# Patient Record
Sex: Female | Born: 1995 | Race: Black or African American | Hispanic: No | Marital: Single | Smoking: Never smoker
Health system: Southern US, Community
[De-identification: ages and names within clinical notes are randomized; demographics above are authoritative.]

## PROBLEM LIST (undated history)

## (undated) DIAGNOSIS — F32A Depression, unspecified: Secondary | ICD-10-CM

## (undated) DIAGNOSIS — O021 Missed abortion: Secondary | ICD-10-CM

## (undated) DIAGNOSIS — F329 Major depressive disorder, single episode, unspecified: Secondary | ICD-10-CM

## (undated) DIAGNOSIS — D649 Anemia, unspecified: Secondary | ICD-10-CM

## (undated) HISTORY — DX: Missed abortion: O02.1

## (undated) HISTORY — PX: NO PAST SURGERIES: SHX2092

## (undated) HISTORY — PX: WISDOM TOOTH EXTRACTION: SHX21

## (undated) SURGERY — Surgical Case
Anesthesia: Regional

---
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
## 2016-01-12 ENCOUNTER — Encounter: Payer: Medicaid Other | Admitting: Adult Health

## 2016-01-12 ENCOUNTER — Other Ambulatory Visit: Payer: Medicaid Other

## 2016-01-20 ENCOUNTER — Encounter: Payer: Self-pay | Admitting: Women's Health

## 2016-01-20 ENCOUNTER — Ambulatory Visit (INDEPENDENT_AMBULATORY_CARE_PROVIDER_SITE_OTHER): Payer: Medicaid Other | Admitting: Women's Health

## 2016-01-20 ENCOUNTER — Other Ambulatory Visit: Payer: Medicaid Other

## 2016-01-20 VITALS — BP 102/56 | HR 76 | Wt 174.0 lb

## 2016-01-20 DIAGNOSIS — Z331 Pregnant state, incidental: Secondary | ICD-10-CM

## 2016-01-20 DIAGNOSIS — O34219 Maternal care for unspecified type scar from previous cesarean delivery: Secondary | ICD-10-CM

## 2016-01-20 DIAGNOSIS — Z3A29 29 weeks gestation of pregnancy: Secondary | ICD-10-CM

## 2016-01-20 DIAGNOSIS — Z3483 Encounter for supervision of other normal pregnancy, third trimester: Secondary | ICD-10-CM

## 2016-01-20 DIAGNOSIS — Z1389 Encounter for screening for other disorder: Secondary | ICD-10-CM

## 2016-01-20 DIAGNOSIS — Z3493 Encounter for supervision of normal pregnancy, unspecified, third trimester: Secondary | ICD-10-CM

## 2016-01-20 LAB — POCT URINALYSIS DIPSTICK
Blood, UA: NEGATIVE
GLUCOSE UA: NEGATIVE
KETONES UA: NEGATIVE
Nitrite, UA: NEGATIVE

## 2016-01-20 NOTE — Patient Instructions (Addendum)
You will have your sugar test next visit.  Please do not eat or drink anything after midnight the night before you come, not even water.  You will be here for at least two hours.     Call the office ([phone]) or go to [facility] if:  You begin to have strong, frequent contractions  Your water breaks.  Sometimes it is a big gush of fluid, sometimes it is just a trickle that keeps getting your panties wet or running down your legs  You have vaginal bleeding.  It is normal to have a small amount of spotting if your cervix was checked.   You don't feel your baby moving like normal.  If you don't, get you something to eat and drink and lay down and focus on feeling your baby move.  You should feel at least 10 movements in 2 hours.  If you don't, you should call the office or go to [facility].    Tdap Vaccine  It is recommended that you get the Tdap vaccine during the third trimester of EACH pregnancy to help protect your baby from getting pertussis (whooping cough)  27-36 weeks is the BEST time to do this so that you can pass the protection on to your baby. During pregnancy is better than after pregnancy, but if you are unable to get it during pregnancy it will be offered at the hospital.   You can get this vaccine at the health department or your family doctor  Everyone who will be around your baby should also be up-to-date on their vaccines. Adults (who are not pregnant) only need 1 dose of Tdap during adulthood.   Third Trimester of Pregnancy The third trimester is from week 29 through week 42, months 7 through 9. The third trimester is a time when the fetus is growing rapidly. At the end of the ninth month, the fetus is about 20 inches in length and weighs 6-10 pounds.  BODY CHANGES Your body goes through many changes during pregnancy. The changes vary from woman to woman.   Your weight will continue to increase. You can expect to gain 25-35 pounds (11-16 kg) by the end of the  pregnancy.  You may begin to get stretch marks on your hips, abdomen, and breasts.  You may urinate more often because the fetus is moving lower into your pelvis and pressing on your bladder.  You may develop or continue to have heartburn as a result of your pregnancy.  You may develop constipation because certain hormones are causing the muscles that push waste through your intestines to slow down.  You may develop hemorrhoids or swollen, bulging veins (varicose veins).  You may have pelvic pain because of the weight gain and pregnancy hormones relaxing your joints between the bones in your pelvis. Backaches may result from overexertion of the muscles supporting your posture.  You may have changes in your hair. These can include thickening of your hair, rapid growth, and changes in texture. Some women also have hair loss during or after pregnancy, or hair that feels dry or thin. Your hair will most likely return to normal after your baby is born.  Your breasts will continue to grow and be tender. A yellow discharge may leak from your breasts called colostrum.  Your belly button may stick out.  You may feel short of breath because of your expanding uterus.  You may notice the fetus "dropping," or moving lower in your abdomen.  You may have   a bloody mucus discharge. This usually occurs a few days to a week before labor begins.  Your cervix becomes thin and soft (effaced) near your due date. WHAT TO EXPECT AT YOUR PRENATAL EXAMS  You will have prenatal exams every 2 weeks until week 36. Then, you will have weekly prenatal exams. During a routine prenatal visit:  You will be weighed to make sure you and the fetus are growing normally.  Your blood pressure is taken.  Your abdomen will be measured to track your baby's growth.  The fetal heartbeat will be listened to.  Any test results from the previous visit will be discussed.  You may have a cervical check near your due date to  see if you have effaced. At around 36 weeks, your caregiver will check your cervix. At the same time, your caregiver will also perform a test on the secretions of the vaginal tissue. This test is to determine if a type of bacteria, Group B streptococcus, is present. Your caregiver will explain this further. Your caregiver may ask you:  What your birth plan is.  How you are feeling.  If you are feeling the baby move.  If you have had any abnormal symptoms, such as leaking fluid, bleeding, severe headaches, or abdominal cramping.  If you have any questions. Other tests or screenings that may be performed during your third trimester include:  Blood tests that check for low iron levels (anemia).  Fetal testing to check the health, activity level, and growth of the fetus. Testing is done if you have certain medical conditions or if there are problems during the pregnancy. FALSE LABOR You may feel small, irregular contractions that eventually go away. These are called Braxton Hicks contractions, or false labor. Contractions may last for hours, days, or even weeks before true labor sets in. If contractions come at regular intervals, intensify, or become painful, it is best to be seen by your caregiver.  SIGNS OF LABOR   Menstrual-like cramps.  Contractions that are 5 minutes apart or less.  Contractions that start on the top of the uterus and spread down to the lower abdomen and back.  A sense of increased pelvic pressure or back pain.  A watery or bloody mucus discharge that comes from the vagina. If you have any of these signs before the 37th week of pregnancy, call your caregiver right away. You need to go to the hospital to get checked immediately. HOME CARE INSTRUCTIONS   Avoid all smoking, herbs, alcohol, and unprescribed drugs. These chemicals affect the formation and growth of the baby.  Follow your caregiver's instructions regarding medicine use. There are medicines that are  either safe or unsafe to take during pregnancy.  Exercise only as directed by your caregiver. Experiencing uterine cramps is a good sign to stop exercising.  Continue to eat regular, healthy meals.  Wear a good support bra for breast tenderness.  Do not use hot tubs, steam rooms, or saunas.  Wear your seat belt at all times when driving.  Avoid raw meat, uncooked cheese, cat litter boxes, and soil used by cats. These carry germs that can cause birth defects in the baby.  Take your prenatal vitamins.  Try taking a stool softener (if your caregiver approves) if you develop constipation. Eat more high-fiber foods, such as fresh vegetables or fruit and whole grains. Drink plenty of fluids to keep your urine clear or pale yellow.  Take warm sitz baths to soothe any pain or discomfort caused   by hemorrhoids. Use hemorrhoid cream if your caregiver approves.  If you develop varicose veins, wear support hose. Elevate your feet for 15 minutes, 3-4 times a day. Limit salt in your diet.  Avoid heavy lifting, wear low heal shoes, and practice good posture.  Rest a lot with your legs elevated if you have leg cramps or low back pain.  Visit your dentist if you have not gone during your pregnancy. Use a soft toothbrush to brush your teeth and be gentle when you floss.  A sexual relationship may be continued unless your caregiver directs you otherwise.  Do not travel far distances unless it is absolutely necessary and only with the approval of your caregiver.  Take prenatal classes to understand, practice, and ask questions about the labor and delivery.  Make a trial run to the hospital.  Pack your hospital bag.  Prepare the baby's nursery.  Continue to go to all your prenatal visits as directed by your caregiver. SEEK MEDICAL CARE IF:  You are unsure if you are in labor or if your water has broken.  You have dizziness.  You have mild pelvic cramps, pelvic pressure, or nagging pain in  your abdominal area.  You have persistent nausea, vomiting, or diarrhea.  You have a bad smelling vaginal discharge.  You have pain with urination. SEEK IMMEDIATE MEDICAL CARE IF:   You have a fever.  You are leaking fluid from your vagina.  You have spotting or bleeding from your vagina.  You have severe abdominal cramping or pain.  You have rapid weight loss or gain.  You have shortness of breath with chest pain.  You notice sudden or extreme swelling of your face, hands, ankles, feet, or legs.  You have not felt your baby move in over an hour.  You have severe headaches that do not go away with medicine.  You have vision changes. Document Released: 09/14/2001 Document Revised: 09/25/2013 Document Reviewed: 11/21/2012 ExitCare Patient Information 2015 [employer]. This information is not intended to replace advice given to you by your health care provider. Make sure you discuss any questions you have with your health care provider.   

## 2016-01-20 NOTE — Progress Notes (Signed)
Low-risk OB appointment G2P1001 9637w0d Estimated Date of Delivery: 04/06/16 BP 102/56 mmHg  Pulse 76  Wt 174 lb (78.926 kg)  LMP  (LMP Unknown)  BP, weight, and urine reviewed.  Refer to obstetrical flow sheet for FH & FHR.  Reports good fm.  Denies regular uc's, lof, vb, or uti s/s. Threw up the glucola, will try again this week- to drink slowly but w/in 5min time frame.  Wants repeat c/s.  Reviewed ptl s/s, fkc. Recommended Tdap at HD/PCP per CDC guidelines.  Plan:  Continue routine obstetrical care  F/U this week for PN2 (no visit), then 3wks for OB appointment

## 2016-01-21 ENCOUNTER — Telehealth: Payer: Self-pay | Admitting: *Deleted

## 2016-01-21 ENCOUNTER — Other Ambulatory Visit: Payer: Self-pay | Admitting: Women's Health

## 2016-01-21 LAB — CBC
HEMATOCRIT: 30.8 % — AB (ref 34.0–46.6)
Hemoglobin: 9.7 g/dL — ABNORMAL LOW (ref 11.1–15.9)
MCH: 29 pg (ref 26.6–33.0)
MCHC: 31.5 g/dL (ref 31.5–35.7)
MCV: 92 fL (ref 79–97)
Platelets: 264 10*3/uL (ref 150–379)
RBC: 3.35 x10E6/uL — AB (ref 3.77–5.28)
RDW: 13.9 % (ref 12.3–15.4)
WBC: 9.7 10*3/uL (ref 3.4–10.8)

## 2016-01-21 LAB — RPR: RPR Ser Ql: NONREACTIVE

## 2016-01-21 LAB — HIV ANTIBODY (ROUTINE TESTING W REFLEX): HIV Screen 4th Generation wRfx: NONREACTIVE

## 2016-01-21 LAB — ANTIBODY SCREEN: ANTIBODY SCREEN: NEGATIVE

## 2016-01-21 MED ORDER — FERROUS SULFATE 325 (65 FE) MG PO TABS: 325.0000 mg | ORAL_TABLET | Freq: Two times a day (BID) | ORAL | Status: DC

## 2016-01-21 NOTE — Telephone Encounter (Signed)
Pt informed per Joellyn HaffKim Booker, CNM, Dx anemia, Fe supplement e-scribed, continue PNV, increase Fe-rich foods, red meat, greeny leafy veg, beans. Pt verbalized understanding.

## 2016-01-23 ENCOUNTER — Other Ambulatory Visit: Payer: Medicaid Other

## 2016-01-23 DIAGNOSIS — Z131 Encounter for screening for diabetes mellitus: Secondary | ICD-10-CM

## 2016-01-23 DIAGNOSIS — Z369 Encounter for antenatal screening, unspecified: Secondary | ICD-10-CM

## 2016-01-24 LAB — GLUCOSE TOLERANCE, 2 HOURS W/ 1HR
Glucose, 1 hour: 130 mg/dL (ref 65–179)
Glucose, 2 hour: 112 mg/dL (ref 65–152)
Glucose, Fasting: 79 mg/dL (ref 65–91)

## 2016-02-09 ENCOUNTER — Encounter: Payer: Self-pay | Admitting: Obstetrics & Gynecology

## 2016-02-09 ENCOUNTER — Ambulatory Visit (INDEPENDENT_AMBULATORY_CARE_PROVIDER_SITE_OTHER): Payer: Medicaid Other | Admitting: Obstetrics & Gynecology

## 2016-02-09 VITALS — BP 110/60 | HR 78 | Wt 176.0 lb

## 2016-02-09 DIAGNOSIS — Z3493 Encounter for supervision of normal pregnancy, unspecified, third trimester: Secondary | ICD-10-CM

## 2016-02-09 DIAGNOSIS — Z331 Pregnant state, incidental: Secondary | ICD-10-CM

## 2016-02-09 DIAGNOSIS — Z029 Encounter for administrative examinations, unspecified: Secondary | ICD-10-CM

## 2016-02-09 DIAGNOSIS — Z1389 Encounter for screening for other disorder: Secondary | ICD-10-CM

## 2016-02-09 LAB — POCT URINALYSIS DIPSTICK
GLUCOSE UA: NEGATIVE
KETONES UA: NEGATIVE
Leukocytes, UA: NEGATIVE
Nitrite, UA: NEGATIVE
Protein, UA: 1
RBC UA: NEGATIVE

## 2016-02-09 NOTE — Progress Notes (Signed)
G2P1001 5619w6d Estimated Date of Delivery: 04/06/16  Blood pressure 110/60, pulse 78, weight 176 lb (79.833 kg).   BP weight and urine results all reviewed and noted.  Please refer to the obstetrical flow sheet for the fundal height and fetal heart rate documentation:  Patient reports good fetal movement, denies any bleeding and no rupture of membranes symptoms or regular contractions. Patient is without complaints. All questions were answered.  Orders Placed This Encounter  Procedures  . POCT urinalysis dipstick    Plan:  Continued routine obstetrical care,   No Follow-up on file.

## 2016-02-10 ENCOUNTER — Encounter: Payer: Medicaid Other | Admitting: Women's Health

## 2016-02-23 ENCOUNTER — Encounter: Payer: Medicaid Other | Admitting: Women's Health

## 2016-02-24 ENCOUNTER — Encounter: Payer: Medicaid Other | Admitting: Women's Health

## 2016-02-25 ENCOUNTER — Encounter: Payer: Medicaid Other | Admitting: Advanced Practice Midwife

## 2016-02-26 ENCOUNTER — Encounter: Payer: Self-pay | Admitting: Advanced Practice Midwife

## 2016-02-26 ENCOUNTER — Ambulatory Visit (INDEPENDENT_AMBULATORY_CARE_PROVIDER_SITE_OTHER): Payer: Medicaid Other | Admitting: Advanced Practice Midwife

## 2016-02-26 VITALS — BP 112/60 | HR 104 | Wt 173.0 lb

## 2016-02-26 DIAGNOSIS — F329 Major depressive disorder, single episode, unspecified: Secondary | ICD-10-CM

## 2016-02-26 DIAGNOSIS — Z1389 Encounter for screening for other disorder: Secondary | ICD-10-CM

## 2016-02-26 DIAGNOSIS — F32A Depression, unspecified: Secondary | ICD-10-CM

## 2016-02-26 DIAGNOSIS — Z3A35 35 weeks gestation of pregnancy: Secondary | ICD-10-CM

## 2016-02-26 DIAGNOSIS — Z3483 Encounter for supervision of other normal pregnancy, third trimester: Secondary | ICD-10-CM

## 2016-02-26 DIAGNOSIS — O360131 Maternal care for anti-D [Rh] antibodies, third trimester, fetus 1: Secondary | ICD-10-CM | POA: Diagnosis not present

## 2016-02-26 DIAGNOSIS — Z331 Pregnant state, incidental: Secondary | ICD-10-CM

## 2016-02-26 DIAGNOSIS — Z3493 Encounter for supervision of normal pregnancy, unspecified, third trimester: Secondary | ICD-10-CM

## 2016-02-26 MED ORDER — RHO D IMMUNE GLOBULIN 1500 UNIT/2ML IJ SOSY
300.0000 ug | PREFILLED_SYRINGE | Freq: Once | INTRAMUSCULAR | Status: AC
  Administered 2016-02-26: 300 ug via INTRAMUSCULAR

## 2016-02-26 NOTE — Progress Notes (Signed)
G2P1001 3050w2d Estimated Date of Delivery: 04/06/16  Blood pressure 112/60, pulse 104, weight 173 lb (78.472 kg).   BP weight and urine results all reviewed and noted.  Please refer to the obstetrical flow sheet for the fundal height and fetal heart rate documentation:  Patient reports good fetal movement, denies any bleeding and no rupture of membranes symptoms or regular contractions. Patient is without complaints. All questions were answered.  Orders Placed This Encounter  Procedures  . POCT urinalysis dipstick    Plan:  Continued routine obstetrical care,   Return in about 2 weeks (around 03/11/2016) for LROB., GBS/GC/CHL (having 39 week CS--need to schedule )

## 2016-02-26 NOTE — Progress Notes (Signed)
Pt denies any problems or concerns at this time.  

## 2016-03-10 ENCOUNTER — Ambulatory Visit (INDEPENDENT_AMBULATORY_CARE_PROVIDER_SITE_OTHER): Payer: Medicaid Other | Admitting: Obstetrics and Gynecology

## 2016-03-10 ENCOUNTER — Encounter: Payer: Self-pay | Admitting: Obstetrics and Gynecology

## 2016-03-10 VITALS — BP 130/80 | HR 111 | Wt 178.0 lb

## 2016-03-10 DIAGNOSIS — O360131 Maternal care for anti-D [Rh] antibodies, third trimester, fetus 1: Secondary | ICD-10-CM

## 2016-03-10 DIAGNOSIS — F329 Major depressive disorder, single episode, unspecified: Secondary | ICD-10-CM

## 2016-03-10 DIAGNOSIS — Z331 Pregnant state, incidental: Secondary | ICD-10-CM

## 2016-03-10 DIAGNOSIS — Z3483 Encounter for supervision of other normal pregnancy, third trimester: Secondary | ICD-10-CM

## 2016-03-10 DIAGNOSIS — F32A Depression, unspecified: Secondary | ICD-10-CM

## 2016-03-10 DIAGNOSIS — Z3493 Encounter for supervision of normal pregnancy, unspecified, third trimester: Secondary | ICD-10-CM

## 2016-03-10 DIAGNOSIS — O34219 Maternal care for unspecified type scar from previous cesarean delivery: Secondary | ICD-10-CM

## 2016-03-10 DIAGNOSIS — Z3A37 37 weeks gestation of pregnancy: Secondary | ICD-10-CM

## 2016-03-10 DIAGNOSIS — Z1389 Encounter for screening for other disorder: Secondary | ICD-10-CM

## 2016-03-10 LAB — POCT URINALYSIS DIPSTICK
Blood, UA: NEGATIVE
Glucose, UA: NEGATIVE
Ketones, UA: NEGATIVE
Leukocytes, UA: NEGATIVE
Nitrite, UA: NEGATIVE

## 2016-03-10 NOTE — Progress Notes (Signed)
Pt denies any problems or concerns at this time.  

## 2016-03-10 NOTE — Progress Notes (Signed)
Patient ID: Kaitlyn MoteSierra Fisher, female   DOB: 1996/07/08, 20 y.o.   MRN: [id number]  G2P1001 6327w1d Estimated Date of Delivery: 04/06/16  Blood pressure 130/80, pulse 111, weight 178 lb (80.74 kg).   refer to the ob flow sheet for FH and FHR, also BP, Wt, Urine results:notable for trace protein  Patient reports good fetal movement, denies any bleeding and no rupture of membranes symptoms or regular contractions. Patient complaints: none. This is her second pregnancy. Her first delivery was by [place] because her bones were too small. She was over a week past her due date and they induced. She has not scheduled the surgery yet.  FHR: 156 bpm FH: 35 cm  Questions were answered. Assessment: LROB G2P1001 @ 6427w1d   Discussed birth control options with pt   Pt given family planning literature   Plan:  Continued routine obstetrical care  Schedule for cesarean section for June 27 7:30   F/u in 1 weeks for regular pnx care   By signing my name below, I, Marisue HumbleMichelle Chaffee, attest that this documentation has been prepared under the direction and in the presence of Tilda BurrowJohn Cloria Ciresi V, MD . Electronically Signed: Marisue HumbleMichelle Chaffee, Scribe. 03/10/2016. 3:13 PM.  I personally performed the services described in this documentation, which was SCRIBED in my presence. The recorded information has been reviewed and considered accurate. It has been edited as necessary during review. Tilda BurrowFERGUSON,Mickell Birdwell V, MD

## 2016-03-15 ENCOUNTER — Encounter (HOSPITAL_COMMUNITY): Payer: Self-pay

## 2016-03-17 ENCOUNTER — Ambulatory Visit (INDEPENDENT_AMBULATORY_CARE_PROVIDER_SITE_OTHER): Payer: Medicaid Other | Admitting: Obstetrics and Gynecology

## 2016-03-17 ENCOUNTER — Encounter: Payer: Self-pay | Admitting: Obstetrics and Gynecology

## 2016-03-17 VITALS — BP 132/84 | HR 85 | Wt 177.0 lb

## 2016-03-17 DIAGNOSIS — Z1389 Encounter for screening for other disorder: Secondary | ICD-10-CM

## 2016-03-17 DIAGNOSIS — Z331 Pregnant state, incidental: Secondary | ICD-10-CM

## 2016-03-17 DIAGNOSIS — Z3493 Encounter for supervision of normal pregnancy, unspecified, third trimester: Secondary | ICD-10-CM

## 2016-03-17 DIAGNOSIS — O34219 Maternal care for unspecified type scar from previous cesarean delivery: Secondary | ICD-10-CM

## 2016-03-17 LAB — POCT URINALYSIS DIPSTICK
GLUCOSE UA: NEGATIVE
KETONES UA: NEGATIVE
NITRITE UA: NEGATIVE
RBC UA: NEGATIVE

## 2016-03-17 NOTE — Progress Notes (Signed)
Patient ID: Rica MoteSierra Siddiqi, female   DOB: 12/03/1995, 20 y.o.   MRN: [id number]  G2P1001 2390w1d Estimated Date of Delivery: 04/06/16 LROB  Patient reports good fetal movement, denies any bleeding and no rupture of membranes symptoms or regular contractions. Patient complaints: none. Pt is considering a c-section and did not labor during her last pregnancy. Extensive effort has been to encourage her to consider vaginal birth after cesarean effort; however she refuses to consider this despite previous conversations encouraging her, and repeated efforts at this time Pt plans to breastfeed. She plans to use Implanon after delivery.  Blood pressure 132/84, pulse 85, weight 177 lb (80.287 kg).  refer to the ob flow sheet for FH and FHR, also BP, Wt, Urine results:notable for trace protein and +1 leukocytes                          Physical Examination: General appearance - alert, well appearing, and in no distress, oriented to person, place,       and time and normal appearing weight                                      Abdomen - FHR 144 BPM                                                         -FH 38 cm                                                         - soft, nontender, nondistended, no masses or organomegaly, fetal position      vertex on abdominal exam                                                                                Questions were answered. Assessment: LROB G2P1001 @ 6290w1d    Discussed TOLAC, pt desires to proceed with repeat c-section  Plan:  Continued routine obstetrical care  F/u in 1 weeks for regular pnx care   By signing my name below, I, Marisue HumbleMichelle Chaffee, attest that this documentation has been prepared under the direction and in the presence of Tilda BurrowJohn V Mayzee Reichenbach, MD . Electronically Signed: Marisue HumbleMichelle Chaffee, Scribe. 03/17/2016. 12:10 PM.  I personally performed the services described in this documentation, which was SCRIBED in my presence. The recorded information has  been reviewed and considered accurate. It has been edited as necessary during review. Tilda BurrowFERGUSON,Sharonann Malbrough V, MD

## 2016-03-17 NOTE — Progress Notes (Signed)
Pt denies any problems or concerns at this time.  

## 2016-03-24 ENCOUNTER — Encounter: Payer: Medicaid Other | Admitting: Women's Health

## 2016-03-24 NOTE — Telephone Encounter (Signed)
Unable to reach pt

## 2016-03-25 ENCOUNTER — Encounter: Payer: Medicaid Other | Admitting: Obstetrics & Gynecology

## 2016-03-25 ENCOUNTER — Encounter: Payer: Medicaid Other | Admitting: Advanced Practice Midwife

## 2016-03-26 NOTE — Patient Instructions (Signed)
21 Kaitlyn Fisher  03/26/2016   Your procedure is scheduled on:  03/30/16  Enter through the [facility] at 0545 AM.  Pick up the phone at the desk and dial [phone].   Call this number if you have problems the morning of surgery: [phone]   Remember:   Do not eat food:After Midnight.  Do not drink clear liquids: After Midnight.  Take these medicines the morning of surgery with A SIP OF WATER: none   Do not wear jewelry, make-up or nail polish.  Do not wear lotions, powders, or perfumes. You may wear deodorant.  Do not shave 48 hours prior to surgery.  Do not bring valuables to the hospital.  [facility] is not   responsible for any belongings or valuables brought to the hospital.  Contacts, dentures or bridgework may not be worn into surgery.  Leave suitcase in the car. After surgery it may be brought to your room.  For patients admitted to the hospital, checkout time is 11:00 AM the day of              discharge.   Patients discharged the day of surgery will not be allowed to drive             home.  Name and phone number of your driver: na  Special Instructions:   Shower using CHG 2 nights before surgery and the night before surgery.  If you shower the day of surgery use CHG.  Use special wash - you have one bottle of CHG for all showers.  You should use approximately 1/3 of the bottle for each shower.   Please read over the following fact sheets that you were given:   Surgical Site Infection Prevention

## 2016-03-29 ENCOUNTER — Encounter (HOSPITAL_COMMUNITY): Payer: Self-pay

## 2016-03-29 ENCOUNTER — Telehealth: Payer: Self-pay | Admitting: *Deleted

## 2016-03-29 ENCOUNTER — Encounter (HOSPITAL_COMMUNITY)
Admission: RE | Admit: 2016-03-29 | Discharge: 2016-03-29 | Disposition: A | Discharge status: Home / Self Care | Payer: Medicaid Other | Source: Ambulatory Visit | Attending: Obstetrics and Gynecology | Admitting: Obstetrics and Gynecology

## 2016-03-29 HISTORY — DX: Major depressive disorder, single episode, unspecified: F32.9

## 2016-03-29 HISTORY — DX: Depression, unspecified: F32.A

## 2016-03-29 LAB — CBC
HEMATOCRIT: 27.3 % — AB (ref 36.0–46.0)
HEMOGLOBIN: 8.1 g/dL — AB (ref 12.0–15.0)
MCH: 24.1 pg — ABNORMAL LOW (ref 26.0–34.0)
MCHC: 29.7 g/dL — ABNORMAL LOW (ref 30.0–36.0)
MCV: 81.3 fL (ref 78.0–100.0)
PLATELETS: 226 10*3/uL (ref 150–400)
RBC: 3.36 MIL/uL — AB (ref 3.87–5.11)
RDW: 16.7 % — ABNORMAL HIGH (ref 11.5–15.5)
WBC: 7.9 10*3/uL (ref 4.0–10.5)

## 2016-03-29 NOTE — H&P (Signed)
Kaitlyn Fisher is a 20 y.o. female presenting for the repeat cesarean section. SHe is breast-feeding, she does not desire tubal sterilization. She had a previous cesarean section without laboring having been told that she had a contracted pelvis patient's now at 7639 weeks old extensively over the option of trial of labor after cesarean and she no interest in attempting to trial of labor she understands that after 2 cesareans likelihood of ever attempting trial of labor is minimal she claims not desire any future children at this time but is of course too young to make effective lifelong decisions History OB History    Gravida Para Term Preterm AB TAB SAB Ectopic Multiple Living   2 1 1       1      Past Medical History  Diagnosis Date  . Depression    Past Surgical History  Procedure Laterality Date  . No past surgeries    . Cesarean section     Family History: family history includes Diabetes in her maternal grandmother; Heart disease in her maternal grandmother. Social History:  reports that she has never smoked. She has never used smokeless tobacco. She reports that she does not drink alcohol or use illicit drugs.   Prenatal Transfer Tool  Maternal Diabetes: No Genetic Screening: Normal Maternal Ultrasounds/Referrals: Normal Fetal Ultrasounds or other Referrals:  None Maternal Substance Abuse:  No Significant Maternal Medications:  None anemic on iron Significant Maternal Lab Results:  Lab values include: Group B Strep negative Other Comments:  None  ROS  Dilation: Fingertip Effacement (%): Thick Station: -3 There were no vitals taken for this visit.   LMP  (LMP Unknown)  Exam Physical Exam  Constitutional: She is oriented to person, place, and time. She appears well-nourished.  HENT:  Head: Normocephalic and atraumatic.  Eyes: Conjunctivae and EOM are normal.  Neck: Normal range of motion.  Cardiovascular: Normal rate and regular rhythm.   Respiratory: Effort normal.   GI: Soft.  Gravid uterus term sized  Genitourinary: Vagina normal and uterus normal.  Musculoskeletal: Normal range of motion.  Neurological: She is alert and oriented to person, place, and time.  Skin: Skin is warm.  Psychiatric: She has a normal mood and affect.    Prenatal labs: ABO, Rh: --/--/A NEG (06/26 1015) Antibody: POS (06/26 1015) Rubella: 2.35 (12/08 1430) RPR: Non Reactive (04/18 1126)  HBsAg: Negative (12/08 1430)  HIV: Non Reactive (04/18 1126)  GBS:   negative  Assessment/Plan: Pregnancy 39 weeks prior cesarean section for contracted pelvis not desiring a trial of labor breast-feeding long-term reversible contraception planned  Patient declines offer a trial of labor resistant to any consideration of labor Hemoglobin is low at 8.1 and will have type and cross scheduled cesarean section on 03/30/2016 7:30 AM Risks rationale and potential benefits and potential complications of cesarean reviewed Kaitlyn Fisher 03/29/2016, 10:47 PM

## 2016-03-29 NOTE — Telephone Encounter (Signed)
Informed pt received Rhogam injection on 02/26/2016, documented under medication history.

## 2016-03-30 ENCOUNTER — Inpatient Hospital Stay (HOSPITAL_COMMUNITY)
Admission: RE | Admit: 2016-03-30 | Discharge: 2016-04-01 | DRG: 766 | Disposition: A | Discharge status: Home / Self Care | Payer: Medicaid Other | Source: Ambulatory Visit | Attending: Obstetrics and Gynecology | Admitting: Obstetrics and Gynecology

## 2016-03-30 ENCOUNTER — Encounter (HOSPITAL_COMMUNITY): Payer: Self-pay | Admitting: Emergency Medicine

## 2016-03-30 ENCOUNTER — Inpatient Hospital Stay (HOSPITAL_COMMUNITY): Payer: Medicaid Other | Admitting: Anesthesiology

## 2016-03-30 ENCOUNTER — Encounter (HOSPITAL_COMMUNITY)
Admission: RE | Disposition: A | Discharge status: Home / Self Care | Payer: Self-pay | Source: Ambulatory Visit | Attending: Obstetrics and Gynecology

## 2016-03-30 DIAGNOSIS — Z3A39 39 weeks gestation of pregnancy: Secondary | ICD-10-CM

## 2016-03-30 DIAGNOSIS — F329 Major depressive disorder, single episode, unspecified: Secondary | ICD-10-CM | POA: Diagnosis present

## 2016-03-30 DIAGNOSIS — O34211 Maternal care for low transverse scar from previous cesarean delivery: Secondary | ICD-10-CM | POA: Diagnosis not present

## 2016-03-30 DIAGNOSIS — O99344 Other mental disorders complicating childbirth: Secondary | ICD-10-CM | POA: Diagnosis present

## 2016-03-30 DIAGNOSIS — D649 Anemia, unspecified: Secondary | ICD-10-CM | POA: Diagnosis not present

## 2016-03-30 DIAGNOSIS — F32A Depression, unspecified: Secondary | ICD-10-CM | POA: Diagnosis present

## 2016-03-30 DIAGNOSIS — O26893 Other specified pregnancy related conditions, third trimester: Secondary | ICD-10-CM | POA: Diagnosis present

## 2016-03-30 DIAGNOSIS — O26899 Other specified pregnancy related conditions, unspecified trimester: Secondary | ICD-10-CM

## 2016-03-30 DIAGNOSIS — Z3493 Encounter for supervision of normal pregnancy, unspecified, third trimester: Secondary | ICD-10-CM

## 2016-03-30 DIAGNOSIS — A749 Chlamydial infection, unspecified: Secondary | ICD-10-CM | POA: Diagnosis present

## 2016-03-30 DIAGNOSIS — Z6791 Unspecified blood type, Rh negative: Secondary | ICD-10-CM | POA: Diagnosis not present

## 2016-03-30 DIAGNOSIS — O9081 Anemia of the puerperium: Secondary | ICD-10-CM | POA: Diagnosis not present

## 2016-03-30 DIAGNOSIS — O98811 Other maternal infectious and parasitic diseases complicating pregnancy, first trimester: Secondary | ICD-10-CM

## 2016-03-30 DIAGNOSIS — O34219 Maternal care for unspecified type scar from previous cesarean delivery: Secondary | ICD-10-CM | POA: Diagnosis present

## 2016-03-30 DIAGNOSIS — Z3490 Encounter for supervision of normal pregnancy, unspecified, unspecified trimester: Secondary | ICD-10-CM

## 2016-03-30 LAB — RPR: RPR Ser Ql: NONREACTIVE

## 2016-03-30 SURGERY — Surgical Case
Anesthesia: Spinal

## 2016-03-30 MED ORDER — SCOPOLAMINE 1 MG/3DAYS TD PT72
1.0000 | MEDICATED_PATCH | Freq: Once | TRANSDERMAL | Status: DC
  Administered 2016-03-30: 1.5 mg via TRANSDERMAL

## 2016-03-30 MED ORDER — OXYCODONE HCL 5 MG PO TABS
5.0000 mg | ORAL_TABLET | ORAL | Status: DC | PRN
Start: 2016-03-30 — End: 2016-04-01

## 2016-03-30 MED ORDER — IBUPROFEN 600 MG PO TABS
600.0000 mg | ORAL_TABLET | Freq: Four times a day (QID) | ORAL | Status: DC
  Administered 2016-03-31 – 2016-04-01 (×6): 600 mg via ORAL
  Filled 2016-03-30 (×7): qty 1

## 2016-03-30 MED ORDER — LACTATED RINGERS IV SOLN
INTRAVENOUS | Status: DC
  Administered 2016-03-30 (×2): via INTRAVENOUS

## 2016-03-30 MED ORDER — SIMETHICONE 80 MG PO CHEW
80.0000 mg | CHEWABLE_TABLET | ORAL | Status: DC
  Administered 2016-03-31 (×2): 80 mg via ORAL
  Filled 2016-03-30 (×2): qty 1

## 2016-03-30 MED ORDER — MEPERIDINE HCL 25 MG/ML IJ SOLN: 6.2500 mg | INTRAMUSCULAR | Status: DC | PRN

## 2016-03-30 MED ORDER — CEFAZOLIN SODIUM-DEXTROSE 2-4 GM/100ML-% IV SOLN
2.0000 g | Freq: Once | INTRAVENOUS | Status: AC
  Administered 2016-03-30: 2 g via INTRAVENOUS
  Filled 2016-03-30: qty 100

## 2016-03-30 MED ORDER — ZOLPIDEM TARTRATE 5 MG PO TABS: 5.0000 mg | ORAL_TABLET | Freq: Every evening | ORAL | Status: DC | PRN

## 2016-03-30 MED ORDER — NALBUPHINE HCL 10 MG/ML IJ SOLN
INTRAMUSCULAR | Status: AC
  Administered 2016-03-30: 5 mg via SUBCUTANEOUS
  Filled 2016-03-30: qty 1

## 2016-03-30 MED ORDER — ACETAMINOPHEN 500 MG PO TABS
1000.0000 mg | ORAL_TABLET | Freq: Four times a day (QID) | ORAL | Status: AC
  Administered 2016-03-31 (×2): 1000 mg via ORAL
  Filled 2016-03-30 (×3): qty 2

## 2016-03-30 MED ORDER — HYDROMORPHONE HCL 1 MG/ML IJ SOLN
0.2500 mg | INTRAMUSCULAR | Status: DC | PRN
  Administered 2016-03-30: 0.25 mg via INTRAVENOUS

## 2016-03-30 MED ORDER — NALOXONE HCL 0.4 MG/ML IJ SOLN: 0.4000 mg | INTRAMUSCULAR | Status: DC | PRN

## 2016-03-30 MED ORDER — OXYCODONE HCL 5 MG PO TABS
10.0000 mg | ORAL_TABLET | ORAL | Status: DC | PRN
Start: 2016-03-30 — End: 2016-04-01

## 2016-03-30 MED ORDER — LACTATED RINGERS IV SOLN
Freq: Once | INTRAVENOUS | Status: AC
  Administered 2016-03-30: 06:00:00 via INTRAVENOUS

## 2016-03-30 MED ORDER — KETOROLAC TROMETHAMINE 30 MG/ML IJ SOLN
INTRAMUSCULAR | Status: AC
  Filled 2016-03-30: qty 1

## 2016-03-30 MED ORDER — KETOROLAC TROMETHAMINE 30 MG/ML IJ SOLN: 30.0000 mg | Freq: Once | INTRAMUSCULAR | Status: DC

## 2016-03-30 MED ORDER — PHENYLEPHRINE 8 MG IN D5W 100 ML (0.08MG/ML) PREMIX OPTIME
INJECTION | INTRAVENOUS | Status: AC
  Filled 2016-03-30: qty 100

## 2016-03-30 MED ORDER — PROMETHAZINE HCL 25 MG/ML IJ SOLN: 6.2500 mg | INTRAMUSCULAR | Status: DC | PRN

## 2016-03-30 MED ORDER — SODIUM CHLORIDE 0.9% FLUSH: 3.0000 mL | INTRAVENOUS | Status: DC | PRN

## 2016-03-30 MED ORDER — SCOPOLAMINE 1 MG/3DAYS TD PT72: 1.0000 | MEDICATED_PATCH | Freq: Once | TRANSDERMAL | Status: DC

## 2016-03-30 MED ORDER — DIPHENHYDRAMINE HCL 50 MG/ML IJ SOLN
12.5000 mg | INTRAMUSCULAR | Status: DC | PRN
Start: 2016-03-30 — End: 2016-04-01

## 2016-03-30 MED ORDER — HYDROMORPHONE HCL 1 MG/ML IJ SOLN
INTRAMUSCULAR | Status: AC
  Filled 2016-03-30: qty 1

## 2016-03-30 MED ORDER — NALBUPHINE HCL 10 MG/ML IJ SOLN: 5.0000 mg | Freq: Once | INTRAMUSCULAR | Status: DC | PRN

## 2016-03-30 MED ORDER — LACTATED RINGERS IV SOLN
INTRAVENOUS | Status: DC
  Administered 2016-03-30 (×3): via INTRAVENOUS

## 2016-03-30 MED ORDER — WITCH HAZEL-GLYCERIN EX PADS: 1.0000 "application " | MEDICATED_PAD | CUTANEOUS | Status: DC | PRN

## 2016-03-30 MED ORDER — OXYTOCIN 40 UNITS IN LACTATED RINGERS INFUSION - SIMPLE MED: 2.5000 [IU]/h | INTRAVENOUS | Status: AC

## 2016-03-30 MED ORDER — ACETAMINOPHEN 325 MG PO TABS
650.0000 mg | ORAL_TABLET | ORAL | Status: DC | PRN
  Administered 2016-03-31: 650 mg via ORAL
  Filled 2016-03-30 (×2): qty 2

## 2016-03-30 MED ORDER — NALOXONE HCL 2 MG/2ML IJ SOSY
1.0000 ug/kg/h | PREFILLED_SYRINGE | INTRAVENOUS | Status: DC | PRN
  Filled 2016-03-30: qty 2

## 2016-03-30 MED ORDER — ONDANSETRON HCL 4 MG/2ML IJ SOLN
INTRAMUSCULAR | Status: AC
  Filled 2016-03-30: qty 2

## 2016-03-30 MED ORDER — OXYTOCIN 10 UNIT/ML IJ SOLN
INTRAMUSCULAR | Status: AC
  Filled 2016-03-30: qty 4

## 2016-03-30 MED ORDER — FENTANYL CITRATE (PF) 100 MCG/2ML IJ SOLN
INTRAMUSCULAR | Status: DC | PRN
  Administered 2016-03-30: 20 ug via INTRATHECAL

## 2016-03-30 MED ORDER — PRENATAL MULTIVITAMIN CH
1.0000 | ORAL_TABLET | Freq: Every day | ORAL | Status: DC
  Administered 2016-03-31: 1 via ORAL
  Filled 2016-03-30: qty 1

## 2016-03-30 MED ORDER — OXYTOCIN 10 UNIT/ML IJ SOLN
40.0000 [IU] | INTRAVENOUS | Status: DC | PRN
  Administered 2016-03-30: 40 [IU] via INTRAVENOUS

## 2016-03-30 MED ORDER — ONDANSETRON HCL 4 MG/2ML IJ SOLN
INTRAMUSCULAR | Status: DC | PRN
  Administered 2016-03-30: 4 mg via INTRAVENOUS

## 2016-03-30 MED ORDER — KETOROLAC TROMETHAMINE 30 MG/ML IJ SOLN
30.0000 mg | Freq: Four times a day (QID) | INTRAMUSCULAR | Status: AC | PRN
  Administered 2016-03-30: 30 mg via INTRAMUSCULAR

## 2016-03-30 MED ORDER — MORPHINE SULFATE (PF) 0.5 MG/ML IJ SOLN
INTRAMUSCULAR | Status: AC
  Filled 2016-03-30: qty 10

## 2016-03-30 MED ORDER — NALBUPHINE HCL 10 MG/ML IJ SOLN: 5.0000 mg | INTRAMUSCULAR | Status: DC | PRN

## 2016-03-30 MED ORDER — SCOPOLAMINE 1 MG/3DAYS TD PT72
MEDICATED_PATCH | TRANSDERMAL | Status: AC
  Administered 2016-03-30: 1.5 mg via TRANSDERMAL
  Filled 2016-03-30: qty 1

## 2016-03-30 MED ORDER — BUPIVACAINE IN DEXTROSE 0.75-8.25 % IT SOLN
INTRATHECAL | Status: DC | PRN
  Administered 2016-03-30: 10.5 mg via INTRATHECAL

## 2016-03-30 MED ORDER — SIMETHICONE 80 MG PO CHEW: 80.0000 mg | CHEWABLE_TABLET | ORAL | Status: DC | PRN

## 2016-03-30 MED ORDER — NALBUPHINE HCL 10 MG/ML IJ SOLN
5.0000 mg | INTRAMUSCULAR | Status: DC | PRN
  Administered 2016-03-30: 5 mg via SUBCUTANEOUS

## 2016-03-30 MED ORDER — KETOROLAC TROMETHAMINE 30 MG/ML IJ SOLN: 30.0000 mg | Freq: Four times a day (QID) | INTRAMUSCULAR | Status: AC | PRN

## 2016-03-30 MED ORDER — MORPHINE SULFATE (PF) 0.5 MG/ML IJ SOLN
INTRAMUSCULAR | Status: DC | PRN
  Administered 2016-03-30: .2 mg via INTRATHECAL

## 2016-03-30 MED ORDER — FENTANYL CITRATE (PF) 100 MCG/2ML IJ SOLN
INTRAMUSCULAR | Status: AC
  Filled 2016-03-30: qty 2

## 2016-03-30 MED ORDER — DIPHENHYDRAMINE HCL 25 MG PO CAPS: 25.0000 mg | ORAL_CAPSULE | ORAL | Status: DC | PRN

## 2016-03-30 MED ORDER — IBUPROFEN 600 MG PO TABS: 600.0000 mg | ORAL_TABLET | Freq: Four times a day (QID) | ORAL | Status: DC | PRN

## 2016-03-30 MED ORDER — ONDANSETRON HCL 4 MG/2ML IJ SOLN: 4.0000 mg | Freq: Three times a day (TID) | INTRAMUSCULAR | Status: DC | PRN

## 2016-03-30 MED ORDER — MENTHOL 3 MG MT LOZG: 1.0000 | LOZENGE | OROMUCOSAL | Status: DC | PRN

## 2016-03-30 MED ORDER — DIPHENHYDRAMINE HCL 25 MG PO CAPS: 25.0000 mg | ORAL_CAPSULE | Freq: Four times a day (QID) | ORAL | Status: DC | PRN

## 2016-03-30 MED ORDER — TETANUS-DIPHTH-ACELL PERTUSSIS 5-2.5-18.5 LF-MCG/0.5 IM SUSP: 0.5000 mL | Freq: Once | INTRAMUSCULAR | Status: DC

## 2016-03-30 MED ORDER — PHENYLEPHRINE 8 MG IN D5W 100 ML (0.08MG/ML) PREMIX OPTIME
INJECTION | INTRAVENOUS | Status: DC | PRN
  Administered 2016-03-30: 60 ug/min via INTRAVENOUS

## 2016-03-30 MED ORDER — SENNOSIDES-DOCUSATE SODIUM 8.6-50 MG PO TABS
2.0000 | ORAL_TABLET | ORAL | Status: DC
  Administered 2016-03-31 (×2): 2 via ORAL
  Filled 2016-03-30 (×2): qty 2

## 2016-03-30 MED ORDER — SIMETHICONE 80 MG PO CHEW
80.0000 mg | CHEWABLE_TABLET | Freq: Three times a day (TID) | ORAL | Status: DC
  Administered 2016-03-30 – 2016-03-31 (×2): 80 mg via ORAL
  Filled 2016-03-30 (×2): qty 1

## 2016-03-30 MED ORDER — COCONUT OIL OIL: 1.0000 "application " | TOPICAL_OIL | Status: DC | PRN

## 2016-03-30 MED ORDER — DIBUCAINE 1 % RE OINT: 1.0000 "application " | TOPICAL_OINTMENT | RECTAL | Status: DC | PRN

## 2016-03-30 SURGICAL SUPPLY — 38 items
BENZOIN TINCTURE PRP APPL 2/3 (GAUZE/BANDAGES/DRESSINGS) ×3 IMPLANT
CLAMP CORD UMBIL (MISCELLANEOUS) IMPLANT
CLOSURE STERI STRIP 1/2 X4 (GAUZE/BANDAGES/DRESSINGS) ×3 IMPLANT
CLOSURE WOUND 1/2 X4 (GAUZE/BANDAGES/DRESSINGS)
CLOTH BEACON ORANGE TIMEOUT ST (SAFETY) ×3 IMPLANT
DRSG OPSITE POSTOP 4X10 (GAUZE/BANDAGES/DRESSINGS) ×3 IMPLANT
DURAPREP 26ML APPLICATOR (WOUND CARE) ×3 IMPLANT
ELECT REM PT RETURN 9FT ADLT (ELECTROSURGICAL) ×3
ELECTRODE REM PT RTRN 9FT ADLT (ELECTROSURGICAL) ×1 IMPLANT
EXTRACTOR VACUUM KIWI (MISCELLANEOUS) IMPLANT
GLOVE BIO SURGEON ST LM GN SZ9 (GLOVE) ×3 IMPLANT
GLOVE BIOGEL PI IND STRL 7.0 (GLOVE) ×1 IMPLANT
GLOVE BIOGEL PI IND STRL 9 (GLOVE) ×1 IMPLANT
GLOVE BIOGEL PI INDICATOR 7.0 (GLOVE) ×2
GLOVE BIOGEL PI INDICATOR 9 (GLOVE) ×2
GOWN STRL REUS W/TWL 2XL LVL3 (GOWN DISPOSABLE) ×3 IMPLANT
GOWN STRL REUS W/TWL LRG LVL3 (GOWN DISPOSABLE) ×3 IMPLANT
NEEDLE HYPO 25X5/8 SAFETYGLIDE (NEEDLE) IMPLANT
NS IRRIG 1000ML POUR BTL (IV SOLUTION) ×3 IMPLANT
PACK C SECTION WH (CUSTOM PROCEDURE TRAY) ×3 IMPLANT
PAD ABD 7.5X8 STRL (GAUZE/BANDAGES/DRESSINGS) ×3 IMPLANT
PAD OB MATERNITY 4.3X12.25 (PERSONAL CARE ITEMS) ×3 IMPLANT
PENCIL SMOKE EVAC W/HOLSTER (ELECTROSURGICAL) ×3 IMPLANT
RTRCTR C-SECT PINK 25CM LRG (MISCELLANEOUS) IMPLANT
RTRCTR C-SECT PINK 34CM XLRG (MISCELLANEOUS) IMPLANT
STRIP CLOSURE SKIN 1/2X4 (GAUZE/BANDAGES/DRESSINGS) IMPLANT
SUT MNCRL 0 VIOLET CTX 36 (SUTURE) ×3 IMPLANT
SUT MONOCRYL 0 CTX 36 (SUTURE) ×6
SUT PLAIN 2 0 (SUTURE) ×4
SUT PLAIN ABS 2-0 CT1 27XMFL (SUTURE) ×2 IMPLANT
SUT VIC AB 0 CT1 27 (SUTURE) ×2
SUT VIC AB 0 CT1 27XBRD ANBCTR (SUTURE) ×1 IMPLANT
SUT VIC AB 2-0 CT1 27 (SUTURE) ×2
SUT VIC AB 2-0 CT1 TAPERPNT 27 (SUTURE) ×1 IMPLANT
SUT VIC AB 4-0 KS 27 (SUTURE) ×3 IMPLANT
SYR BULB IRRIGATION 50ML (SYRINGE) IMPLANT
TOWEL OR 17X24 6PK STRL BLUE (TOWEL DISPOSABLE) ×3 IMPLANT
TRAY FOLEY CATH SILVER 14FR (SET/KITS/TRAYS/PACK) ×3 IMPLANT

## 2016-03-30 NOTE — Lactation Note (Signed)
This note was copied from a baby's chart. Lactation Consultation Note  Patient Name: Girl Rica MoteSierra Mcgurk Today's Date: 03/30/2016 Reason for consult: Initial assessment (mom encouarged to call on the nurses light for feeding assessment with feeding cues )  Baby is 4 hours old and has been to the breast x 2 , per mom once in the recovery for 10 mins , and grandmother confirmed. And attempt on MBU. HNV, HNS.  LC encouraged mom to call on the nurses light for feeding assessment  With feeding cues, baby presently held by a family member  Sound asleep. Mom sleepy after conversation. Mother informed of post-discharge support and given phone number to the lactation department, including services for phone call  assistance; out-patient appointments; and breastfeeding support group. List of other breastfeeding resources in the community given  in the handout. Encouraged mother to call for problems or concerns related to breastfeeding.    Maternal Data    Feeding Feeding Type: Breast Fed Length of feed: 5 min  LATCH Score/Interventions Latch: Grasps breast easily, tongue down, lips flanged, rhythmical sucking.  Audible Swallowing: A few with stimulation Intervention(s): Skin to skin  Type of Nipple: Everted at rest and after stimulation  Comfort (Breast/Nipple): Soft / non-tender     Hold (Positioning): Full assist, staff holds infant at breast  LATCH Score: 7  Lactation Tools Discussed/Used WIC Program: No (per mom )   Consult Status Consult Status: Follow-up Date: 03/30/16 Follow-up type: In-patient    Kathrin Greathouseorio, Kamaya Keckler Ann 03/30/2016, 12:28 PM

## 2016-03-30 NOTE — Op Note (Signed)
Please see the brief operative note for surgical details 

## 2016-03-30 NOTE — Anesthesia Postprocedure Evaluation (Signed)
Anesthesia Post Note  Patient: Rica MoteSierra Braley  Procedure(s) Performed: Procedure(s) (LRB): REPEAT CESAREAN SECTION (N/A)  Patient location during evaluation: PACU Anesthesia Type: Spinal Level of consciousness: awake Pain management: pain level controlled Vital Signs Assessment: post-procedure vital signs reviewed and stable Respiratory status: spontaneous breathing Cardiovascular status: stable Postop Assessment: no headache, no backache, spinal receding, patient able to bend at knees and no signs of nausea or vomiting Anesthetic complications: no     Last Vitals:  Filed Vitals:   03/30/16 0945 03/30/16 1000  BP: 121/85 118/76  Pulse: 70 68  Temp:    Resp: 23 11    Last Pain:  Filed Vitals:   03/30/16 1005  PainSc: 0-No pain   Pain Goal: Patients Stated Pain Goal: 3 (03/30/16 0602)               Farida Mcreynolds JR,JOHN Susann GivensFRANKLIN

## 2016-03-30 NOTE — Brief Op Note (Signed)
03/30/2016  8:49 AM  PATIENT:  Kaitlyn Fisher  20 y.o. 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[place])  SURGEON:  Surgeon(s) and Role:    * Tilda BurrowJohn V Mahika Vanvoorhis, MD - Primary    * Federico FlakeKimberly Niles Newton, MD - Assisting  PHYSICIAN ASSISTANT:   ASSISTANTS: none   ANESTHESIA:   spinal  EBL:  Total I/O In: 2300 [I.V.:2300] Out: 700 [Urine:100; Blood:600]  BLOOD ADMINISTERED:none  DRAINS: Urinary Catheter (Foley)   LOCAL MEDICATIONS USED:  NONE  SPECIMEN:  Source of Specimen:  Placenta to labor and deliveryhealthy female infanttaken to the nursery Apgars 9 and 9  DISPOSITION OF SPECIMEN:  N/A  COUNTS:  YES  TOURNIQUET:  * No tourniquets in log *  DICTATION: .Dragon Dictation  PLAN OF CARE: Admit to inpatient   PATIENT DISPOSITION:  PACU - hemodynamically stable.    Delay start of Pharmacological VTE agent (>24hrs) due to surgical blood loss or risk of bleeding: not applicable Indications: A 20 year old gravida 2 para 1001 prior cesarean section declining trial of labor Findings;extremely thin lower uterine segment, 2 mm thickness, essentially a visible window into the amniotic fluid. No intra-abdominal adhesions. 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 The underside of it was cut free and the procedure cesarean performed in standard fashion with sharp dissection down to the  fascia transverse incision through the fascia section of the fascia off of the underlying rectus muscles. Peritoneal cavity was carefully entered and the opened sharplyin the midline. Her blade was positioned, and bladder flap did not require omentum as the lower uterine segment was super thin, 2 mm maximum thickness over a 10 cm wide by 5 cm  Vertical length window into the uterine cavity. The window was nicked opened easily and the fetal vertex rotated into the incision. Delivery was easy. The uterus to down nicely. There was increased amount of blood loss due to tearing into the uterine vein on the patient's left sidewhich was controlled with pressure of the placenta could be delivered and ring forceps placed on  The bleeding bleeding. Ruptured suture was placed above and below the area of bleeding . 0 Monocryl with excellent hemostasis. The incision was then closed in standard fashion with running locking 0 Monocryl first layer and continuous running Monocryl second layer. Abdomen was irrigated. Peritoneum closed in a continuous running fashion with 2-0 Vicryl, fascia closed with 0 Vicryl. Hemostasis was satisfactory. At this point the ellipse of skin and the old cicatrix was completed in its removal, and the below the incision mobilized sufficiently to allow erupted horizontal 20 plain closure of the subcutaneous fatty space using 5 interrupted sutures.subcuticular 4-0 Vicryl completed skin closure and patient was given a pressure dressing and went to recovery room in stable condition with good hemostasis and vital signs. Sponge and needle counts were correct throughout

## 2016-03-30 NOTE — Anesthesia Procedure Notes (Signed)
Spinal Patient location during procedure: OR Start time: 03/30/2016 7:31 AM End time: 03/30/2016 7:35 AM Staffing Anesthesiologist: Leilani AbleHATCHETT, Frida Wahlstrom Preanesthetic Checklist Completed: patient identified, surgical consent, pre-op evaluation, timeout performed, IV checked, risks and benefits discussed and monitors and equipment checked Spinal Block Patient position: sitting Prep: site prepped and draped and DuraPrep Patient monitoring: heart rate, cardiac monitor, continuous pulse ox and blood pressure Approach: midline Location: L3-4 Injection technique: single-shot Needle Needle type: Sprotte  Needle gauge: 24 G Needle length: 9 cm Needle insertion depth: 7 cm Assessment Sensory level: T4 Events: paresthesia Additional Notes R leg X 1. Needle repositioned and spinal then placed.

## 2016-03-30 NOTE — Addendum Note (Signed)
Addendum  created 03/30/16 1336 by Junious SilkMelinda Tabitha Tupper, CRNA   Modules edited: Clinical Notes   Clinical Notes:  File: [id number]

## 2016-03-30 NOTE — Anesthesia Preprocedure Evaluation (Signed)
Anesthesia Evaluation  Patient identified by MRN, date of birth, ID band Patient awake    Reviewed: Allergy & Precautions, H&P , NPO status , Patient's Chart, lab work & pertinent test results  Airway Mallampati: I  TM Distance: >3 FB Neck ROM: full    Dental no notable dental hx.    Pulmonary neg pulmonary ROS,    Pulmonary exam normal        Cardiovascular negative cardio ROS Normal cardiovascular exam     Neuro/Psych negative neurological ROS     GI/Hepatic negative GI ROS, Neg liver ROS,   Endo/Other  negative endocrine ROS  Renal/GU negative Renal ROS     Musculoskeletal   Abdominal (+) + obese,   Peds  Hematology negative hematology ROS (+)   Anesthesia Other Findings   Reproductive/Obstetrics (+) Pregnancy                             Anesthesia Physical Anesthesia Plan  ASA: II  Anesthesia Plan: Spinal   Post-op Pain Management:    Induction:   Airway Management Planned:   Additional Equipment:   Intra-op Plan:   Post-operative Plan:   Informed Consent: I have reviewed the patients History and Physical, chart, labs and discussed the procedure including the risks, benefits and alternatives for the proposed anesthesia with the patient or authorized representative who has indicated his/her understanding and acceptance.     Plan Discussed with: CRNA and Surgeon  Anesthesia Plan Comments:         Anesthesia Quick Evaluation

## 2016-03-30 NOTE — Anesthesia Postprocedure Evaluation (Signed)
Anesthesia Post Note  Patient: Rica MoteSierra Victorian  Procedure(s) Performed: Procedure(s) (LRB): REPEAT CESAREAN SECTION (N/A)  Patient location during evaluation: Mother Baby Anesthesia Type: Spinal Level of consciousness: oriented and awake and alert Pain management: pain level controlled Vital Signs Assessment: post-procedure vital signs reviewed and stable Respiratory status: spontaneous breathing and respiratory function stable Cardiovascular status: blood pressure returned to baseline and stable Postop Assessment: no headache and no backache Anesthetic complications: no     Last Vitals:  Filed Vitals:   03/30/16 1130 03/30/16 1230  BP: 109/53 116/64  Pulse: 77 75  Temp: 36.1 C 36.1 C  Resp: 16 18    Last Pain:  Filed Vitals:   03/30/16 1255  PainSc: 0-No pain   Pain Goal: Patients Stated Pain Goal: 3 (03/30/16 0602)               Junious SilkGILBERT,Niccolas Loeper

## 2016-03-30 NOTE — Interval H&P Note (Signed)
History and Physical Interval Note:  03/30/2016 7:20 AM  Kaitlyn Fisher  has presented today for surgery, with the diagnosis of repeat c-section  The various methods of treatment have been discussed with the patient and family. After consideration of risks, benefits and other options for treatment, the patient has consented to  Procedure(s): REPEAT CESAREAN SECTION (N/A) as a surgical intervention .  The patient's history has been reviewed, patient examined, no change in status, stable for surgery.  I have reviewed the patient's chart and labs.  Questions were answered to the patient's satisfaction.   She is anemic, has been discussed with anesthesia, and is healthy, slim, and second section. Type and screen ordered and compatible.  Will proceed with repeat cesarean  Ragan Duhon V

## 2016-03-30 NOTE — Transfer of Care (Signed)
Immediate Anesthesia Transfer of Care Note  Patient: Kaitlyn Fisher  Procedure(s) Performed: Procedure(s): REPEAT CESAREAN SECTION (N/A)  Patient Location: PACU  Anesthesia Type:Spinal  Level of Consciousness: awake  Airway & Oxygen Therapy: Patient Spontanous Breathing  Post-op Assessment: Report given to RN  Post vital signs: Reviewed and stable  Last Vitals:  Filed Vitals:   03/30/16 0602  BP: 115/82  Pulse: 72  Temp: 36.8 C  Resp: 16    Last Pain: There were no vitals filed for this visit.    Patients Stated Pain Goal: 3 (03/30/16 0602)  Complications: No apparent anesthesia complications

## 2016-03-31 ENCOUNTER — Encounter: Payer: Self-pay | Admitting: *Deleted

## 2016-03-31 LAB — BIRTH TISSUE RECOVERY COLLECTION (PLACENTA DONATION)

## 2016-03-31 LAB — CBC
HCT: 26.4 % — ABNORMAL LOW (ref 36.0–46.0)
Hemoglobin: 7.7 g/dL — ABNORMAL LOW (ref 12.0–15.0)
MCH: 23.8 pg — AB (ref 26.0–34.0)
MCHC: 29.2 g/dL — ABNORMAL LOW (ref 30.0–36.0)
MCV: 81.7 fL (ref 78.0–100.0)
PLATELETS: 217 10*3/uL (ref 150–400)
RBC: 3.23 MIL/uL — ABNORMAL LOW (ref 3.87–5.11)
RDW: 17 % — AB (ref 11.5–15.5)
WBC: 7.1 10*3/uL (ref 4.0–10.5)

## 2016-03-31 NOTE — Progress Notes (Signed)
UR chart review completed.  

## 2016-03-31 NOTE — Progress Notes (Signed)
MOB was referred for history of Anxiety Referral is screened out by Clinical Social Worker  -MOB's symptoms are currently being treated with medication and/or therapy (Zoloft). There are no current psychosocial concerns in chart or throughout pregnancy.    Please contact the Clinical Social Worker if needs arise or upon MOB request.   Deretha EmoryHannah Quinlan Mcfall LCSW, MSW Clinical Social Work: System Insurance underwriterWide Float Coverage for [employer] NICU Clinical social worker [phone]

## 2016-03-31 NOTE — Lactation Note (Signed)
This note was copied from a baby's chart. Lactation Consultation Note  Patient Name: Kaitlyn Fisher [id number]'ZToday's Date: 03/31/2016 Reason for consult: Follow-up assessment Baby at 34 hr of life. Mom reports baby was not latching well so she started offering bottles. She stated lactation latched the baby but did not teach her how to latch baby. Offered help and mom stated she was going to wait till she gets home to bf. Discussed breast changes and milk supply. She is aware of lactation services and support group. She will call as needed.   Maternal Data    Feeding Feeding Type: Bottle Fed - Formula  LATCH Score/Interventions                      Lactation Tools Discussed/Used     Consult Status Consult Status: Complete    Rulon Eisenmengerlizabeth E Lakira Ogando 03/31/2016, 6:35 PM

## 2016-03-31 NOTE — Progress Notes (Signed)
MD notified regarding dressing.  Pressure dressing fell off during shower.  HC drg saturated with old blood.  Pt called out to say Steri strips were wet and hC drg falling off.  I had pt lie back down and placed new HC dressing on pt.  Steri strips/incision is wider than HC dressing. MD aware and said that was ok.  I advised her to come look.  No new orders.

## 2016-03-31 NOTE — Progress Notes (Signed)
Post Partum Day 1/POD#1 Subjective:  Kaitlyn Fisher is a 20 y.o. G2P2002 219w0d s/p rLTCS.  No acute events overnight.  Pt denies problems with ambulating, voiding or po intake.  She denies nausea or vomiting.  Pain is well controlled.  She has had flatus.  Lochia Minimal.  Plan for birth control is Depo-Provera.  Method of Feeding: breast  Objective: Blood pressure 101/83, pulse 78, temperature 98.2 F (36.8 C), temperature source Oral, resp. rate 18, SpO2 99 %, unknown if currently breastfeeding.  Physical Exam:  General: alert, cooperative and no distress Lochia:normal flow Chest: normal WOB Heart: Regular rate Abdomen: +BS, soft, mild TTP (appropriate). Incision dry.  Uterine Fundus: firm DVT Evaluation: No evidence of DVT seen on physical exam. Extremities: No edema   Recent Labs  03/29/16 1015 03/31/16 0538  HGB 8.1* 7.7*  HCT 27.3* 26.4*    Assessment/Plan:  ASSESSMENT: Kaitlyn Fisher is a 20 y.o. [id number] 329w0d s/p rLTCS  Plan for discharge tomorrow Continue routine PP care Breastfeeding support PRN  LOS: 1 day   Federico FlakeKimberly Niles Kenlei Safi 03/31/2016, 7:54 PM

## 2016-04-01 MED ORDER — IBUPROFEN 600 MG PO TABS: 600.0000 mg | ORAL_TABLET | Freq: Four times a day (QID) | ORAL | Status: DC | PRN

## 2016-04-01 MED ORDER — OXYCODONE HCL 5 MG PO TABS: 5.0000 mg | ORAL_TABLET | ORAL | Status: DC | PRN

## 2016-04-01 NOTE — Discharge Instructions (Signed)

## 2016-04-01 NOTE — Discharge Summary (Signed)
OB Discharge Summary     Patient Name: Kaitlyn Fisher DOB: 02-16-1996 MRN: [id number]  Date of admission: 03/30/2016 Delivering MD: Tilda BurrowFERGUSON, JOHN V   Date of discharge: 04/01/2016  Admitting diagnosis: repeat c-section Intrauterine pregnancy: [id number]     Secondary diagnosis:  Principal Problem:   Supervision of normal pregnancy Active Problems:   Chlamydia infection affecting pregnancy in first trimester, antepartum   Rh negative state in antepartum period   Previous cesarean delivery affecting pregnancy, antepartum   Depression   S/P cesarean section  Additional problems: none     Discharge diagnosis: Term Pregnancy Delivered and Anemia                                                                                                Post partum procedures:none (baby Rh neg)  Augmentation: N/A  Complications: None  Hospital course:  Sceduled C/S   20 y.o. yo G2P2002 at 6559w0d was admitted to the hospital 03/30/2016 for scheduled cesarean section with the following indication:Elective Repeat.  Membrane Rupture Time/Date: 8:54 AM ,03/30/2016   Patient delivered a Viable infant.03/30/2016  Details of operation can be found in separate operative note.  Pateint had an uncomplicated postpartum course.  She is ambulating, tolerating a regular diet, passing flatus, and urinating well. Patient is discharged home in stable condition on  04/01/2016          Physical exam  Filed Vitals:   03/30/16 2130 03/31/16 0130 03/31/16 0534 03/31/16 1825  BP: 109/62 109/70 116/71 101/83  Pulse: 88 78 71 78  Temp: 98.5 F (36.9 C) 98.2 F (36.8 C) 98.3 F (36.8 C) 98.2 F (36.8 C)  TempSrc: Oral Oral Oral Oral  Resp: 18 18 18 18   SpO2: 99% 99% 99%    General: alert and cooperative Lochia: appropriate Uterine Fundus: firm Incision: Dressing is clean, dry, and intact DVT Evaluation: No evidence of DVT seen on physical exam. Labs: Lab Results  Component Value Date   WBC 7.1 03/31/2016   HGB  7.7* 03/31/2016   HCT 26.4* 03/31/2016   MCV 81.7 03/31/2016   PLT 217 03/31/2016   No flowsheet data found.  Discharge instruction: per After Visit Summary and "Baby and Me Booklet".  After visit meds:    Medication List    TAKE these medications        ferrous sulfate 325 (65 FE) MG tablet  Take 1 tablet (325 mg total) by mouth 2 (two) times daily with a meal.     ibuprofen 600 MG tablet  Commonly known as:  ADVIL,MOTRIN  Take 1 tablet (600 mg total) by mouth every 6 (six) hours as needed for mild pain.     multivitamin-prenatal 27-0.8 MG Tabs tablet  Take 1 tablet by mouth daily at 12 noon.     oxyCODONE 5 MG immediate release tablet  Commonly known as:  Oxy IR/ROXICODONE  Take 1 tablet (5 mg total) by mouth every 4 (four) hours as needed (pain scale 4-7).     sertraline 50 MG tablet  Commonly known as:  ZOLOFT  Take 50 mg by mouth  daily. Reported on 11/13/2015        Diet: routine diet  Activity: Advance as tolerated. Pelvic rest for 6 weeks.   Outpatient follow up:6 weeks Follow up Appt:No future appointments. Follow up Visit:No Follow-up on file.  Postpartum contraception: Nexplanon  Newborn Data: Live born female  Birth Weight: 7 lb 3.2 oz (3265 g) APGAR: 9, 10  Baby Feeding: Breast Disposition:home with mother   04/01/2016 Cam HaiSHAW, Amariona Rathje, CNM  9:20 AM

## 2016-04-01 NOTE — Lactation Note (Signed)
This note was copied from a baby's chart. Lactation Consultation Note  Patient Name: Kaitlyn Rica MoteSierra Luhn IONGE'XToday's Date: 04/01/2016 Reason for consult: Follow-up assessment   Follow up with mom prior to d/c. Mom reports she does not wish to BF at this time and is planning to try to latch after she gets home. Offered to assist with latch prior to d/c and mom declined.   Reviewed engorgement prevention/treatment with mom . Mom reports she has a DEBP at home, she is not sure what kind. She say she does not know how to use it but says it does have directions.  Enc mom to BF infant 8-12 x in 24 hours before offering formula to stimulate milk production and to prevent engorgement.   Reviewed LC brochure with mom. Mom aware of OP Services, BF Support Groups and LC phone #, enc mom to call with questions/concerns prn.   Maternal Data Formula Feeding for Exclusion: No  Feeding    LATCH Score/Interventions                      Lactation Tools Discussed/Used     Consult Status Consult Status: Complete Follow-up type: Call as needed    Ed BlalockSharon S Ginna Schuur 04/01/2016, 2:32 PM

## 2016-04-02 LAB — TYPE AND SCREEN
ABO/RH(D): A NEG
Antibody Screen: POSITIVE
UNIT DIVISION: 0
Unit division: 0

## 2016-04-07 ENCOUNTER — Ambulatory Visit (INDEPENDENT_AMBULATORY_CARE_PROVIDER_SITE_OTHER): Payer: Medicaid Other | Admitting: Obstetrics and Gynecology

## 2016-04-07 ENCOUNTER — Encounter: Payer: Medicaid Other | Admitting: Obstetrics and Gynecology

## 2016-04-07 ENCOUNTER — Encounter: Payer: Self-pay | Admitting: Obstetrics and Gynecology

## 2016-04-07 VITALS — BP 110/60 | Ht 60.0 in | Wt 157.0 lb

## 2016-04-07 DIAGNOSIS — Z09 Encounter for follow-up examination after completed treatment for conditions other than malignant neoplasm: Secondary | ICD-10-CM

## 2016-04-07 DIAGNOSIS — Z98891 History of uterine scar from previous surgery: Secondary | ICD-10-CM

## 2016-04-07 NOTE — Progress Notes (Signed)
Patient ID: Kaitlyn Fisher, female   DOB: Jan 28, 1996, 20 y.o.   MRN: [id number]    Subjective:  Kaitlyn Fisher is a 20 y.o. female now 1 weeks status post Repeat cesarean section.    Patient reports doing well Review of Systems Negative except    Diet:   Regular   Bowel movements : normal.  Pain is controlled with current analgesics. Medications being used: narcotic analgesics including oxycodone (Oxycontin, Oxyir).  Objective:  BP 110/60 mmHg  Ht 5' (1.524 m)  Wt 157 lb (71.215 kg)  BMI 30.66 kg/m2  LMP  (LMP Unknown)  Breastfeeding? Yes General:Well developed, well nourished.  No acute distress. Abdomen: Bowel sounds normal, soft, non-tender. Pelvic Exam:    External Genitalia:  Normal.    Vagina: Normal    Cervix: Normal   Uterus: Normal    Adnexa/Bimanual: Normal  Incision(s):   Healing Well, no drainage, no erythema, no hernia, no swelling, no dehiscence,     Assessment:  Post-Op 1 weeks s/p Cesarean section   Follow-up 4 weeks team postpartum visit, to be followed shortly by Nexplanon  insertion  Doing well postoperatively.   Plan:  1.Wound care discussed   2. . current medications. 3. Activity restrictions: Routine postpartum restrictions 4. return to work: not applicable. 5. Follow up in 4 weeks.

## 2016-04-08 ENCOUNTER — Encounter: Payer: Self-pay | Admitting: Women's Health

## 2016-04-08 ENCOUNTER — Telehealth: Payer: Self-pay | Admitting: *Deleted

## 2016-04-08 ENCOUNTER — Ambulatory Visit (INDEPENDENT_AMBULATORY_CARE_PROVIDER_SITE_OTHER): Payer: Medicaid Other | Admitting: Women's Health

## 2016-04-08 VITALS — BP 100/60 | Ht 62.0 in | Wt 159.0 lb

## 2016-04-08 DIAGNOSIS — Z98891 History of uterine scar from previous surgery: Secondary | ICD-10-CM

## 2016-04-08 DIAGNOSIS — L7634 Postprocedural seroma of skin and subcutaneous tissue following other procedure: Secondary | ICD-10-CM

## 2016-04-08 DIAGNOSIS — IMO0002 Reserved for concepts with insufficient information to code with codable children: Secondary | ICD-10-CM

## 2016-04-08 NOTE — Telephone Encounter (Signed)
Pt called stating that she took off her steri strips and yellow stuff started pouring out of her incision like water. Pt states that there is an opening on the left end of her incision. Pt is very concerned.   Pt was advised we would need to see her. Phone call was switched to the front office and an appointment was given.

## 2016-04-08 NOTE — Progress Notes (Signed)
Patient ID: Kaitlyn Fisher, female   DOB: July 26, 1996, 20 y.o.   MRN: [id number]   [facility]  Patient name: Kaitlyn Fisher MRN [id number]  Date of birth: July 26, 1996  CC & HPI:  Kaitlyn Fisher is a 20 y.o. 122P2002 African American female 1wk s/p c/s, presenting today for report of drainage from Lt side of c/s incision that began this am. Saw JVF yesterday for incision check and all was normal.  Denies foul odor, fever/chills. Plans nexplanon for contraception- hasn't ordered yet.  No LMP recorded.  Pertinent History Reviewed:  Medical & Surgical Hx:   Past medical, surgical, family, and social history reviewed in electronic medical record Medications: Reviewed & Updated - see associated section Allergies: Reviewed in electronic medical record  Objective Findings:  Vitals: BP 100/60 mmHg  Ht 5\' 2"  (1.575 m)  Wt 159 lb (72.122 kg)  BMI 29.07 kg/m2  Breastfeeding? Yes Body mass index is 29.07 kg/(m^2).  Physical Examination: General appearance - alert, well appearing, and in no distress Abdomen - Rt side incision healing well, Lt side slightly opened, w/ light yellow thick opaque nonodorous drainage, silver nitrate along this side. Lg seroma above incision on Lt, no erythema/heat, LHE co-exam- no indication for antibiotics, de discussed w/ pt to expect intermittent drainage   No results found for this or any previous visit (from the past 24 hour(s)).   Assessment & Plan:  A:   1wk s/p c/s  Incisional seroma w/ drainage  P:  Keep area clean and dry, no baths for now  Abstinence until nexplanon insertion  Return for As scheduled for pp visit, order nexplanon today please.  Marge DuncansBooker, Kimberly Randall CNM, [facility]NP-BC 04/08/2016 11:35 AM

## 2016-04-20 ENCOUNTER — Telehealth: Payer: Self-pay | Admitting: Women's Health

## 2016-04-20 ENCOUNTER — Encounter: Payer: Self-pay | Admitting: Women's Health

## 2016-04-20 ENCOUNTER — Ambulatory Visit (INDEPENDENT_AMBULATORY_CARE_PROVIDER_SITE_OTHER): Payer: Medicaid Other | Admitting: Obstetrics & Gynecology

## 2016-04-20 VITALS — BP 100/70 | HR 78 | Ht 60.0 in | Wt 160.5 lb

## 2016-04-20 DIAGNOSIS — L7682 Other postprocedural complications of skin and subcutaneous tissue: Secondary | ICD-10-CM

## 2016-04-20 DIAGNOSIS — R208 Other disturbances of skin sensation: Secondary | ICD-10-CM

## 2016-04-20 NOTE — Progress Notes (Signed)
Patient ID: Kaitlyn MoteSierra Fisher, female   DOB: 1996/05/11, 20 y.o.   MRN: [id number] Pt in for report of pain at c/s incision when couging/laughing, noticed blood this am, and smells bad. 3wks s/p LTCS. Had seroma on Lt side of incision 04/08/16. Incision appears open, indurated, heat to touch- LHE already left for lunch, will have her come back at 1:30pm to have him see pt.  Cheral MarkerKimberly R. Tierany Appleby, CNM, [facility]NP-BC 04/20/2016 12:50 PM

## 2016-04-20 NOTE — Progress Notes (Signed)
Patient ID: Kaitlyn Fisher, female   DOB: 11/11/1995, 20 y.o.   MRN: [id number] Looked at incision looks fine not infected will drain periodically due to seroma but no infection noted

## 2016-04-20 NOTE — Addendum Note (Signed)
Addended by: Lazaro ArmsEURE, LUTHER H on: 04/20/2016 02:14 PM   Modules accepted: Level of Service

## 2016-04-20 NOTE — Telephone Encounter (Signed)
Pt c/o bleeding from incision site and a lot of pain, c-section 03/30/2016. Pt given an appt for evaluation.

## 2016-04-29 ENCOUNTER — Encounter: Payer: Medicaid Other | Admitting: Advanced Practice Midwife

## 2016-04-29 ENCOUNTER — Encounter: Payer: Self-pay | Admitting: Advanced Practice Midwife

## 2016-04-29 NOTE — Progress Notes (Signed)
No show

## 2016-05-03 ENCOUNTER — Encounter: Payer: Self-pay | Admitting: Women's Health

## 2016-05-03 ENCOUNTER — Ambulatory Visit: Payer: Medicaid Other | Admitting: Women's Health

## 2016-05-17 ENCOUNTER — Ambulatory Visit: Payer: Medicaid Other | Admitting: Women's Health

## 2016-05-27 NOTE — Op Note (Signed)
Please see the brief operative note for surgical details 

## 2016-05-27 NOTE — Brief Op Note (Signed)
03/30/2016  6:34 PM  PATIENT:  Kaitlyn Fisher  20 y.o. female  PRE-OPERATIVE DIAGNOSIS:  repeat c-section and declines trial of labor  POST-OPERATIVE DIAGNOSIS:  repaet c-section and declines trial of labor  PROCEDURE:  Procedure(s): REPEAT CESAREAN SECTION (N/A)  SURGEON:  Surgeon(s) and Role:    * Tilda BurrowJohn V Emeli Goguen, MD - Primary    * Federico FlakeKimberly Niles Newton, MD - Assisting  PHYSICIAN ASSISTANT:   ASSISTANTS: none   ANESTHESIA:   spinal  EBL:  No intake/output data recorded.  BLOOD ADMINISTERED:none  DRAINS: Urinary Catheter (Foley)   LOCAL MEDICATIONS USED:  NONE  SPECIMEN:  Source of Specimen:  Placental to labor and delivery  DISPOSITION OF SPECIMEN:  Labor and delivery  COUNTS:  YES  TOURNIQUET:  * No tourniquets in log *  DICTATION: .Dragon Dictation  PLAN OF CARE: Admit to inpatient   PATIENT DISPOSITION:  PACU - hemodynamically stable.   Delay start of Pharmacological VTE agent (>24hrs) due to surgical blood loss or risk of bleeding: not applicable Details of procedure. Patient was taken operating room prepped and draped for lower abdominal surgery with transverse excision of the old cicatrix sharp dissection to the fascia which was opened transversely and then sharply into the abdominal cavity.\ Alexis wound retractor was positioned, transverse uterine incision performed and fetal vertex rotated into the incision and the infant delivered without difficulty. Cord was clamped. 1 minute was weighted before the clamping performed. In a delivered well in response to current day uterine massage and the baby had been passed to the pediatricians. Please see their notes for details regarding the baby. After placenta was delivered uterus was irrigated, hemostasis confirmed and all membrane remnants apparently removed. Uterus is closed with a running locking first layer 0 Monocryl followed by continuous running second layer, and the abdomen irrigated, anterior peritoneum closed  with 2-0 Vicryl, fascia closed with 0 Vicryl, subcutaneous tissues reapproximated with 3 interrupted horizontal mattress sutures of 2-0 Vicryl and then subcuticular 4-0 Vicryl closure the skin sponge and needle counts correct

## 2016-08-13 DIAGNOSIS — Z029 Encounter for administrative examinations, unspecified: Secondary | ICD-10-CM

## 2016-10-28 ENCOUNTER — Ambulatory Visit: Payer: Medicaid Other | Admitting: Adult Health

## 2016-11-04 ENCOUNTER — Ambulatory Visit: Payer: Medicaid Other | Admitting: Adult Health

## 2016-11-19 ENCOUNTER — Other Ambulatory Visit: Payer: Self-pay | Admitting: Obstetrics and Gynecology

## 2016-11-19 DIAGNOSIS — O3680X0 Pregnancy with inconclusive fetal viability, not applicable or unspecified: Secondary | ICD-10-CM

## 2016-11-22 ENCOUNTER — Ambulatory Visit (INDEPENDENT_AMBULATORY_CARE_PROVIDER_SITE_OTHER): Payer: Medicaid Other

## 2016-11-22 DIAGNOSIS — O3680X0 Pregnancy with inconclusive fetal viability, not applicable or unspecified: Secondary | ICD-10-CM

## 2016-11-22 DIAGNOSIS — Z3A08 8 weeks gestation of pregnancy: Secondary | ICD-10-CM | POA: Diagnosis not present

## 2016-11-22 NOTE — Progress Notes (Addendum)
US 8 wks,single IUP  W/ys,pos fht 159 bpm,normal ov's bilat,crl 17.4 mm,EDD 07/04/2017 by ultrasound

## 2016-12-06 ENCOUNTER — Encounter: Payer: Medicaid Other | Admitting: Women's Health

## 2016-12-06 ENCOUNTER — Ambulatory Visit: Payer: Medicaid Other | Admitting: *Deleted

## 2017-01-05 ENCOUNTER — Encounter: Payer: Self-pay | Admitting: Women's Health

## 2017-01-05 ENCOUNTER — Encounter (INDEPENDENT_AMBULATORY_CARE_PROVIDER_SITE_OTHER): Payer: Self-pay

## 2017-01-05 ENCOUNTER — Ambulatory Visit: Payer: Medicaid Other | Admitting: *Deleted

## 2017-01-05 ENCOUNTER — Ambulatory Visit (INDEPENDENT_AMBULATORY_CARE_PROVIDER_SITE_OTHER): Payer: Medicaid Other | Admitting: Women's Health

## 2017-01-05 VITALS — BP 102/65 | HR 75 | Wt 155.0 lb

## 2017-01-05 DIAGNOSIS — Z3482 Encounter for supervision of other normal pregnancy, second trimester: Secondary | ICD-10-CM | POA: Diagnosis not present

## 2017-01-05 DIAGNOSIS — Z363 Encounter for antenatal screening for malformations: Secondary | ICD-10-CM

## 2017-01-05 DIAGNOSIS — Z331 Pregnant state, incidental: Secondary | ICD-10-CM

## 2017-01-05 DIAGNOSIS — Z3490 Encounter for supervision of normal pregnancy, unspecified, unspecified trimester: Secondary | ICD-10-CM | POA: Insufficient documentation

## 2017-01-05 DIAGNOSIS — Z3A14 14 weeks gestation of pregnancy: Secondary | ICD-10-CM

## 2017-01-05 DIAGNOSIS — O34219 Maternal care for unspecified type scar from previous cesarean delivery: Secondary | ICD-10-CM

## 2017-01-05 DIAGNOSIS — Z1389 Encounter for screening for other disorder: Secondary | ICD-10-CM

## 2017-01-05 LAB — POCT URINALYSIS DIPSTICK
Blood, UA: NEGATIVE
Glucose, UA: NEGATIVE
KETONES UA: NEGATIVE
LEUKOCYTES UA: NEGATIVE
Nitrite, UA: NEGATIVE
Protein, UA: NEGATIVE

## 2017-01-05 MED ORDER — PRENATAL 27-0.8 MG PO TABS: 1.0000 | ORAL_TABLET | Freq: Every day | ORAL | 11 refills | Status: DC

## 2017-01-05 NOTE — Progress Notes (Signed)
  Subjective:  Kaitlyn Fisher is a 21 y.o. G39P2002 African American female at [redacted]w[redacted]d by 8wk u/s, being seen today for her first obstetrical visit.  Her obstetrical history is significant for term c/s x2, 1st CPD, 2nd repeat; depression/anxiety- on zoloft in past, not currently taking- doing well.  Pregnancy history fully reviewed.  Patient reports no complaints. Denies vb, cramping, uti s/s, abnormal/malodorous vag d/c, or vulvovaginal itching/irritation.  BP 102/65   Pulse 75   Wt 155 lb (70.3 kg)   LMP 09/16/2016 (Approximate)   BMI 30.27 kg/m   HISTORY: OB History  Gravida Para Term Preterm AB Living  SAB TAB Ectopic Multiple Live Births        0 2    # Outcome Date GA Lbr Len/2nd Weight Sex Delivery Anes PTL Lv  3 Current           2 Term 03/30/16 [redacted]w[redacted]d  7 lb 3.2 oz (3.265 kg) F CS-LTranv Spinal N LIV  1 Term 12/21/12 [redacted]w[redacted]d  7 lb 3 oz (3.26 kg) M CS-LTranv Spinal N LIV     Complications: Cephalopelvic Disproportion     Past Medical History:  Diagnosis Date  . Depression    pp depression   Past Surgical History:  Procedure Laterality Date  . CESAREAN SECTION    . CESAREAN SECTION N/A 03/30/2016   Procedure: REPEAT CESAREAN SECTION;  Surgeon: Tilda Burrow, MD;  Location: [facility];  Service: Obstetrics;  Laterality: N/A;  . NO PAST SURGERIES    . WISDOM TOOTH EXTRACTION     Family History  Problem Relation Age of Onset  . Heart disease Maternal Grandmother   . Diabetes Maternal Grandmother   . Heart failure Maternal Grandmother   . Asthma Brother     Exam   System:     General: Well developed & nourished, no acute distress   Skin: Warm & dry, normal coloration and turgor, no rashes   Neurologic: Alert & oriented, normal mood   Cardiovascular: Regular rate & rhythm   Respiratory: Effort & rate normal, LCTAB, acyanotic   Abdomen: Soft, non tender   Extremities: normal strength, tone  Thin prep pap smear <21yo  FHR: 150 via doppler    Assessment:   Pregnancy: [id number] Patient Active Problem List   Diagnosis Date Noted  . Encounter for supervision of other normal pregnancy 01/05/2017  . S/P cesarean section 03/30/2016  . Rh negative state in antepartum period 12/15/2015  . Previous cesarean delivery affecting pregnancy, antepartum 12/15/2015  . Depression 12/15/2015    [redacted]w[redacted]d G3P2002 New OB visit C/S x 2 Depression/anxiety  Plan:  Initial labs obtained Continue prenatal vitamins Problem list reviewed and updated Reviewed n/v relief measures and warning s/s to report Reviewed recommended weight gain based on pre-gravid BMI Encouraged well-balanced diet Genetic Screening discussed Quad Screen: declined Cystic fibrosis screening discussed declined Ultrasound discussed; fetal survey: requested Follow up in 4 weeks for visit and anatomy u/s CCNC completed Wants repeat c/s  Marge Duncans CNM, WHNP-BC 01/05/2017 2:26 PM

## 2017-01-05 NOTE — Patient Instructions (Signed)
Nausea & Vomiting  Have saltine crackers or pretzels by your bed and eat a few bites before you raise your head out of bed in the morning  Eat small frequent meals throughout the day instead of large meals  Drink plenty of fluids throughout the day to stay hydrated, just don't drink a lot of fluids with your meals.  This can make your stomach fill up faster making you feel sick  Do not brush your teeth right after you eat  Products with real ginger are good for nausea, like ginger ale and ginger hard candy Make sure it says made with real ginger!  Sucking on sour candy like lemon heads is also good for nausea  If your prenatal vitamins make you nauseated, take them at night so you will sleep through the nausea  Sea Bands  If you feel like you need medicine for the nausea & vomiting please let us know  If you are unable to keep any fluids or food down please let us know   Constipation  Drink plenty of fluid, preferably water, throughout the day  Eat foods high in fiber such as fruits, vegetables, and grains  Exercise, such as walking, is a good way to keep your bowels regular  Drink warm fluids, especially warm prune juice, or decaf coffee  Eat a 1/2 cup of real oatmeal (not instant), 1/2 cup applesauce, and 1/2-1 cup warm prune juice every day  If needed, you may take Colace (docusate sodium) stool softener once or twice a day to help keep the stool soft. If you are pregnant, wait until you are out of your first trimester (12-14 weeks of pregnancy)  If you still are having problems with constipation, you may take Miralax once daily as needed to help keep your bowels regular.  If you are pregnant, wait until you are out of your first trimester (12-14 weeks of pregnancy)  Second Trimester of Pregnancy The second trimester is from week 14 through week 27 (months 4 through 6). The second trimester is often a time when you feel your best. Your body has adjusted to being pregnant,  and you begin to feel better physically. Usually, morning sickness has lessened or quit completely, you may have more energy, and you may have an increase in appetite. The second trimester is also a time when the fetus is growing rapidly. At the end of the sixth month, the fetus is about 9 inches long and weighs about 1 pounds. You will likely begin to feel the baby move (quickening) between 16 and 20 weeks of pregnancy. Body changes during your second trimester Your body continues to go through many changes during your second trimester. The changes vary from woman to woman.  Your weight will continue to increase. You will notice your lower abdomen bulging out.  You may begin to get stretch marks on your hips, abdomen, and breasts.  You may develop headaches that can be relieved by medicines. The medicines should be approved by your health care provider.  You may urinate more often because the fetus is pressing on your bladder.  You may develop or continue to have heartburn as a result of your pregnancy.  You may develop constipation because certain hormones are causing the muscles that push waste through your intestines to slow down.  You may develop hemorrhoids or swollen, bulging veins (varicose veins).  You may have back pain. This is caused by:  Weight gain.  Pregnancy hormones that are relaxing the joints  in your pelvis.  A shift in weight and the muscles that support your balance.  Your breasts will continue to grow and they will continue to become tender.  Your gums may bleed and may be sensitive to brushing and flossing.  Dark spots or blotches (chloasma, mask of pregnancy) may develop on your face. This will likely fade after the baby is born.  A dark line from your belly button to the pubic area (linea nigra) may appear. This will likely fade after the baby is born.  You may have changes in your hair. These can include thickening of your hair, rapid growth, and changes  in texture. Some women also have hair loss during or after pregnancy, or hair that feels dry or thin. Your hair will most likely return to normal after your baby is born. What to expect at prenatal visits During a routine prenatal visit:  You will be weighed to make sure you and the fetus are growing normally.  Your blood pressure will be taken.  Your abdomen will be measured to track your baby's growth.  The fetal heartbeat will be listened to.  Any test results from the previous visit will be discussed. Your health care provider may ask you:  How you are feeling.  If you are feeling the baby move.  If you have had any abnormal symptoms, such as leaking fluid, bleeding, severe headaches, or abdominal cramping.  If you are using any tobacco products, including cigarettes, chewing tobacco, and electronic cigarettes.  If you have any questions. Other tests that may be performed during your second trimester include:  Blood tests that check for:  Low iron levels (anemia).  High blood sugar that affects pregnant women (gestational diabetes) between 79 and 28 weeks.  Rh antibodies. This is to check for a protein on red blood cells (Rh factor).  Urine tests to check for infections, diabetes, or protein in the urine.  An ultrasound to confirm the proper growth and development of the baby.  An amniocentesis to check for possible genetic problems.  Fetal screens for spina bifida and Down syndrome.  HIV (human immunodeficiency virus) testing. Routine prenatal testing includes screening for HIV, unless you choose not to have this test. Follow these instructions at home: Medicines   Follow your health care provider's instructions regarding medicine use. Specific medicines may be either safe or unsafe to take during pregnancy.  Take a prenatal vitamin that contains at least 600 micrograms (mcg) of folic acid.  If you develop constipation, try taking a stool softener if your health  care provider approves. Eating and drinking   Eat a balanced diet that includes fresh fruits and vegetables, whole grains, good sources of protein such as meat, eggs, or tofu, and low-fat dairy. Your health care provider will help you determine the amount of weight gain that is right for you.  Avoid raw meat and uncooked cheese. These carry germs that can cause birth defects in the baby.  If you have low calcium intake from food, talk to your health care provider about whether you should take a daily calcium supplement.  Limit foods that are high in fat and processed sugars, such as fried and sweet foods.  To prevent constipation:  Drink enough fluid to keep your urine clear or pale yellow.  Eat foods that are high in fiber, such as fresh fruits and vegetables, whole grains, and beans. Activity   Exercise only as directed by your health care provider. Most women can continue  their usual exercise routine during pregnancy. Try to exercise for 30 minutes at least 5 days a week. Stop exercising if you experience uterine contractions.  Avoid heavy lifting, wear low heel shoes, and practice good posture.  A sexual relationship may be continued unless your health care provider directs you otherwise. Relieving pain and discomfort   Wear a good support bra to prevent discomfort from breast tenderness.  Take warm sitz baths to soothe any pain or discomfort caused by hemorrhoids. Use hemorrhoid cream if your health care provider approves.  Rest with your legs elevated if you have leg cramps or low back pain.  If you develop varicose veins, wear support hose. Elevate your feet for 15 minutes, 3-4 times a day. Limit salt in your diet. Prenatal Care   Write down your questions. Take them to your prenatal visits.  Keep all your prenatal visits as told by your health care provider. This is important. Safety   Wear your seat belt at all times when driving.  Make a list of emergency phone  numbers, including numbers for family, friends, the hospital, and police and fire departments. General instructions   Ask your health care provider for a referral to a local prenatal education class. Begin classes no later than the beginning of month 6 of your pregnancy.  Ask for help if you have counseling or nutritional needs during pregnancy. Your health care provider can offer advice or refer you to specialists for help with various needs.  Do not use hot tubs, steam rooms, or saunas.  Do not douche or use tampons or scented sanitary pads.  Do not cross your legs for long periods of time.  Avoid cat litter boxes and soil used by cats. These carry germs that can cause birth defects in the baby and possibly loss of the fetus by miscarriage or stillbirth.  Avoid all smoking, herbs, alcohol, and unprescribed drugs. Chemicals in these products can affect the formation and growth of the baby.  Do not use any products that contain nicotine or tobacco, such as cigarettes and e-cigarettes. If you need help quitting, ask your health care provider.  Visit your dentist if you have not gone yet during your pregnancy. Use a soft toothbrush to brush your teeth and be gentle when you floss. Contact a health care provider if:  You have dizziness.  You have mild pelvic cramps, pelvic pressure, or nagging pain in the abdominal area.  You have persistent nausea, vomiting, or diarrhea.  You have a bad smelling vaginal discharge.  You have pain when you urinate. Get help right away if:  You have a fever.  You are leaking fluid from your vagina.  You have spotting or bleeding from your vagina.  You have severe abdominal cramping or pain.  You have rapid weight gain or weight loss.  You have shortness of breath with chest pain.  You notice sudden or extreme swelling of your face, hands, ankles, feet, or legs.  You have not felt your baby move in over an hour.  You have severe headaches  that do not go away when you take medicine.  You have vision changes. Summary  The second trimester is from week 14 through week 27 (months 4 through 6). It is also a time when the fetus is growing rapidly.  Your body goes through many changes during pregnancy. The changes vary from woman to woman.  Avoid all smoking, herbs, alcohol, and unprescribed drugs. These chemicals affect the formation and growth  your baby.  Do not use any tobacco products, such as cigarettes, chewing tobacco, and e-cigarettes. If you need help quitting, ask your health care provider.  Contact your health care provider if you have any questions. Keep all prenatal visits as told by your health care provider. This is important. This information is not intended to replace advice given to you by your health care provider. Make sure you discuss any questions you have with your health care provider. Document Released: 09/14/2001 Document Revised: 02/26/2016 Document Reviewed: 11/21/2012 [employer] Interactive Patient Education  2017 [employer]  

## 2017-01-06 ENCOUNTER — Ambulatory Visit: Payer: Medicaid Other | Admitting: *Deleted

## 2017-01-06 LAB — ABO/RH: Rh Factor: NEGATIVE

## 2017-01-06 LAB — CBC
HEMATOCRIT: 33.9 % — AB (ref 34.0–46.6)
Hemoglobin: 10.6 g/dL — ABNORMAL LOW (ref 11.1–15.9)
MCH: 27.5 pg (ref 26.6–33.0)
MCHC: 31.3 g/dL — ABNORMAL LOW (ref 31.5–35.7)
MCV: 88 fL (ref 79–97)
Platelets: 269 10*3/uL (ref 150–379)
RBC: 3.85 x10E6/uL (ref 3.77–5.28)
RDW: 15 % (ref 12.3–15.4)
WBC: 8.3 10*3/uL (ref 3.4–10.8)

## 2017-01-06 LAB — PMP SCREEN PROFILE (10S), URINE
AMPHETAMINE SCRN UR: NEGATIVE ng/mL
BARBITURATE SCRN UR: NEGATIVE ng/mL
BENZODIAZEPINE SCREEN, URINE: NEGATIVE ng/mL
CANNABINOIDS UR QL SCN: NEGATIVE ng/mL
COCAINE(METAB.) SCREEN, URINE: NEGATIVE ng/mL
Creatinine(Crt), U: 314.1 mg/dL — ABNORMAL HIGH (ref 20.0–300.0)
Methadone Scn, Ur: NEGATIVE ng/mL
Opiate Scrn, Ur: NEGATIVE ng/mL
Oxycodone+Oxymorphone Ur Ql Scn: NEGATIVE ng/mL
PCP SCRN UR: NEGATIVE ng/mL
Ph of Urine: 6 (ref 4.5–8.9)
Propoxyphene, Screen: NEGATIVE ng/mL

## 2017-01-06 LAB — URINALYSIS, ROUTINE W REFLEX MICROSCOPIC
BILIRUBIN UA: NEGATIVE
GLUCOSE, UA: NEGATIVE
LEUKOCYTES UA: NEGATIVE
Nitrite, UA: NEGATIVE
RBC UA: NEGATIVE
Urobilinogen, Ur: 1 mg/dL (ref 0.2–1.0)
pH, UA: 6 (ref 5.0–7.5)

## 2017-01-06 LAB — RUBELLA SCREEN: Rubella Antibodies, IGG: 2.13 index (ref >0.99)

## 2017-01-06 LAB — RPR: RPR Ser Ql: NONREACTIVE

## 2017-01-06 LAB — HIV ANTIBODY (ROUTINE TESTING W REFLEX): HIV Screen 4th Generation wRfx: NONREACTIVE

## 2017-01-06 LAB — HEPATITIS B SURFACE ANTIGEN: Hepatitis B Surface Ag: NEGATIVE

## 2017-01-06 LAB — VARICELLA ZOSTER ANTIBODY, IGG: Varicella zoster IgG: 3715 index (ref >165)

## 2017-01-06 LAB — ANTIBODY SCREEN: Antibody Screen: NEGATIVE

## 2017-01-07 ENCOUNTER — Telehealth: Payer: Self-pay | Admitting: *Deleted

## 2017-01-07 LAB — URINE CULTURE

## 2017-01-07 LAB — GC/CHLAMYDIA PROBE AMP
Chlamydia trachomatis, NAA: NEGATIVE
NEISSERIA GONORRHOEAE BY PCR: NEGATIVE

## 2017-01-07 NOTE — Telephone Encounter (Signed)
Verbal order called for PNV 28-1mg  to [facility].

## 2017-01-07 NOTE — Telephone Encounter (Signed)
[facility] does not have PNV strength prescribed. They want to change to either; 27-1mg  tabs or 28-0.8mg  tabs   Please advise.

## 2017-02-03 ENCOUNTER — Ambulatory Visit (INDEPENDENT_AMBULATORY_CARE_PROVIDER_SITE_OTHER): Payer: Medicaid Other | Admitting: Women's Health

## 2017-02-03 ENCOUNTER — Encounter: Payer: Self-pay | Admitting: Women's Health

## 2017-02-03 ENCOUNTER — Ambulatory Visit (INDEPENDENT_AMBULATORY_CARE_PROVIDER_SITE_OTHER): Payer: Medicaid Other

## 2017-02-03 VITALS — BP 110/70 | HR 77 | Wt 156.0 lb

## 2017-02-03 DIAGNOSIS — Z363 Encounter for antenatal screening for malformations: Secondary | ICD-10-CM

## 2017-02-03 DIAGNOSIS — Z3482 Encounter for supervision of other normal pregnancy, second trimester: Secondary | ICD-10-CM

## 2017-02-03 DIAGNOSIS — Z1389 Encounter for screening for other disorder: Secondary | ICD-10-CM

## 2017-02-03 DIAGNOSIS — Z331 Pregnant state, incidental: Secondary | ICD-10-CM

## 2017-02-03 LAB — POCT URINALYSIS DIPSTICK
Glucose, UA: NEGATIVE
KETONES UA: NEGATIVE
NITRITE UA: NEGATIVE
PROTEIN UA: NEGATIVE
RBC UA: NEGATIVE

## 2017-02-03 NOTE — Progress Notes (Signed)
Low-risk OB appointment [id number] Estimated Date of Delivery: 07/04/17 BP 110/70   Pulse 77   Wt 156 lb (70.8 kg)   LMP 09/16/2016 (Approximate)   BMI 30.47 kg/m   BP, weight, and urine reviewed.  Refer to obstetrical flow sheet for FH & FHR.  Reports good fm.  Denies regular uc's, lof, vb, or uti s/s. No complaints. Reviewed normal anatomy u/s, warning s/s to rpeort. Plan:  Continue routine obstetrical care  F/U in 4wks for OB appointment

## 2017-02-03 NOTE — Patient Instructions (Signed)

## 2017-02-03 NOTE — Progress Notes (Signed)
US 18+3 wks,cephalic,post fundal pl gr 0,normal ovaries bilat,cx 4.6 cm,fhr 145 bpm,svp of fluid 5 cm,EFW 227 g,anatomy complete,no obvious abnormalities seen

## 2017-03-03 ENCOUNTER — Encounter: Payer: Self-pay | Admitting: Advanced Practice Midwife

## 2017-03-03 ENCOUNTER — Encounter: Payer: Self-pay | Admitting: Women's Health

## 2017-03-03 ENCOUNTER — Ambulatory Visit (INDEPENDENT_AMBULATORY_CARE_PROVIDER_SITE_OTHER): Payer: Medicaid Other | Admitting: Advanced Practice Midwife

## 2017-03-03 VITALS — BP 110/62 | HR 72 | Wt 160.0 lb

## 2017-03-03 DIAGNOSIS — Z1389 Encounter for screening for other disorder: Secondary | ICD-10-CM

## 2017-03-03 DIAGNOSIS — Z331 Pregnant state, incidental: Secondary | ICD-10-CM

## 2017-03-03 DIAGNOSIS — Z3482 Encounter for supervision of other normal pregnancy, second trimester: Secondary | ICD-10-CM

## 2017-03-03 LAB — POCT URINALYSIS DIPSTICK
GLUCOSE UA: NEGATIVE
KETONES UA: NEGATIVE
Leukocytes, UA: NEGATIVE
Nitrite, UA: NEGATIVE
RBC UA: NEGATIVE

## 2017-03-03 NOTE — Patient Instructions (Addendum)
1. Before your test, do not eat or drink anything for 8-10 hours prior to your  appointment (a small amount of water is allowed and you may take any medicines you normally take). Be sure to drink lots of water the day before. 2. When you arrive, your blood will be drawn for a 'fasting' blood sugar level.  Then you will be given a sweetened carbonated beverage to drink. You should  complete drinking this beverage within five minutes. After finishing the  beverage, you will have your blood drawn exactly 1 and 2 hours later. Having  your blood drawn on time is an important part of this test. A total of three blood  samples will be done. 3. The test takes approximately 2  hours. During the test, do not have anything to  eat or drink. Do not smoke, chew gum (not even sugarless gum) or use breath mints.  4. During the test you should remain close by and seated as much as possible and  avoid walking around. You may want to bring a book or something else to  occupy your time.  5. After your test, you may eat and drink as normal. You may want to bring a snack  to eat after the test is finished. Your provider will advise you as to the results of  this test and any follow-up if necessary  If your sugar test is positive for gestational diabetes, you will be given an phone call and further instructions discussed. If you wish to know all of your test results before your next appointment, feel free to call the office, or look up your test results on Mychart.  (The range that the lab uses for normal values of the sugar test are not necessarily the range that is used for pregnant women; if your results are within the normal range, they are definitely normal.  However, if a value is deemed "high" by the lab, it may not be too high for a pregnant woman.  We will need to discuss the results if your value(s) fall in the "high" category).     Tdap Vaccine  It is recommended that you get the Tdap vaccine during the  third trimester of EACH pregnancy to help protect your baby from getting pertussis (whooping cough)  27-36 weeks is the BEST time to do this so that you can pass the protection on to your baby. During pregnancy is better than after pregnancy, but if you are unable to get it during pregnancy it will be offered at the hospital.  You can get this vaccine at the health department or your family doctor, as well as some pharmacies.  Everyone who will be around your baby should also be up-to-date on their vaccines. Adults (who are not pregnant) only need 1 dose of Tdap during adulthood. 1. Before your test, do not eat or drink anything for 8-10 hours prior to your  appointment (a small amount of water is allowed and you may take any medicines you normally take). Be sure to drink lots of water the day before. 2. When you arrive, your blood will be drawn for a 'fasting' blood sugar level.  Then you will be given a sweetened carbonated beverage to drink. You should  complete drinking this beverage within five minutes. After finishing the  beverage, you will have your blood drawn exactly 1 and 2 hours later. Having  your blood drawn on time is an important part of this test. A total of  three blood  samples will be done. 3. The test takes approximately 2  hours. During the test, do not have anything to  eat or drink. Do not smoke, chew gum (not even sugarless gum) or use breath mints.  4. During the test you should remain close by and seated as much as possible and  avoid walking around. You may want to bring a book or something else to  occupy your time.  5. After your test, you may eat and drink as normal. You may want to bring a snack  to eat after the test is finished. Your provider will advise you as to the results of  this test and any follow-up if necessary  If your sugar test is positive for gestational diabetes, you will be given an phone call and further instructions discussed. If you wish to  know all of your test results before your next appointment, feel free to call the office, or look up your test results on Mychart.  (The range that the lab uses for normal values of the sugar test are not necessarily the range that is used for pregnant women; if your results are within the normal range, they are definitely normal.  However, if a value is deemed "high" by the lab, it may not be too high for a pregnant woman.  We will need to discuss the results if your value(s) fall in the "high" category).     Tdap Vaccine  It is recommended that you get the Tdap vaccine during the third trimester of EACH pregnancy to help protect your baby from getting pertussis (whooping cough)  27-36 weeks is the BEST time to do this so that you can pass the protection on to your baby. During pregnancy is better than after pregnancy, but if you are unable to get it during pregnancy it will be offered at the hospital.  You can get this vaccine at the health department or your family doctor, as well as some pharmacies.  Everyone who will be around your baby should also be up-to-date on their vaccines. Adults (who are not pregnant) only need 1 dose of Tdap during adulthood.        Second Trimester of Pregnancy The second trimester is from week 14 through week 27 (months 4 through 6). The second trimester is often a time when you feel your best. Your body has adjusted to being pregnant, and you begin to feel better physically. Usually, morning sickness has lessened or quit completely, you may have more energy, and you may have an increase in appetite. The second trimester is also a time when the fetus is growing rapidly. At the end of the sixth month, the fetus is about 9 inches long and weighs about 1 pounds. You will likely begin to feel the baby move (quickening) between 16 and 20 weeks of pregnancy. Body changes during your second trimester Your body continues to go through many changes during your second  trimester. The changes vary from woman to woman.  Your weight will continue to increase. You will notice your lower abdomen bulging out.  You may begin to get stretch marks on your hips, abdomen, and breasts.  You may develop headaches that can be relieved by medicines. The medicines should be approved by your health care provider.  You may urinate more often because the fetus is pressing on your bladder.  You may develop or continue to have heartburn as a result of your pregnancy.  You may develop  constipation because certain hormones are causing the muscles that push waste through your intestines to slow down.  You may develop hemorrhoids or swollen, bulging veins (varicose veins).  You may have back pain. This is caused by: ? Weight gain. ? Pregnancy hormones that are relaxing the joints in your pelvis. ? A shift in weight and the muscles that support your balance.  Your breasts will continue to grow and they will continue to become tender.  Your gums may bleed and may be sensitive to brushing and flossing.  Dark spots or blotches (chloasma, mask of pregnancy) may develop on your face. This will likely fade after the baby is born.  A dark line from your belly button to the pubic area (linea nigra) may appear. This will likely fade after the baby is born.  You may have changes in your hair. These can include thickening of your hair, rapid growth, and changes in texture. Some women also have hair loss during or after pregnancy, or hair that feels dry or thin. Your hair will most likely return to normal after your baby is born.  What to expect at prenatal visits During a routine prenatal visit:  You will be weighed to make sure you and the fetus are growing normally.  Your blood pressure will be taken.  Your abdomen will be measured to track your baby's growth.  The fetal heartbeat will be listened to.  Any test results from the previous visit will be discussed.  Your  health care provider may ask you:  How you are feeling.  If you are feeling the baby move.  If you have had any abnormal symptoms, such as leaking fluid, bleeding, severe headaches, or abdominal cramping.  If you are using any tobacco products, including cigarettes, chewing tobacco, and electronic cigarettes.  If you have any questions.  Other tests that may be performed during your second trimester include:  Blood tests that check for: ? Low iron levels (anemia). ? High blood sugar that affects pregnant women (gestational diabetes) between 79 and 28 weeks. ? Rh antibodies. This is to check for a protein on red blood cells (Rh factor).  Urine tests to check for infections, diabetes, or protein in the urine.  An ultrasound to confirm the proper growth and development of the baby.  An amniocentesis to check for possible genetic problems.  Fetal screens for spina bifida and Down syndrome.  HIV (human immunodeficiency virus) testing. Routine prenatal testing includes screening for HIV, unless you choose not to have this test.  Follow these instructions at home: Medicines  Follow your health care provider's instructions regarding medicine use. Specific medicines may be either safe or unsafe to take during pregnancy.  Take a prenatal vitamin that contains at least 600 micrograms (mcg) of folic acid.  If you develop constipation, try taking a stool softener if your health care provider approves. Eating and drinking  Eat a balanced diet that includes fresh fruits and vegetables, whole grains, good sources of protein such as meat, eggs, or tofu, and low-fat dairy. Your health care provider will help you determine the amount of weight gain that is right for you.  Avoid raw meat and uncooked cheese. These carry germs that can cause birth defects in the baby.  If you have low calcium intake from food, talk to your health care provider about whether you should take a daily calcium  supplement.  Limit foods that are high in fat and processed sugars, such as fried and sweet  foods.  To prevent constipation: ? Drink enough fluid to keep your urine clear or pale yellow. ? Eat foods that are high in fiber, such as fresh fruits and vegetables, whole grains, and beans. Activity  Exercise only as directed by your health care provider. Most women can continue their usual exercise routine during pregnancy. Try to exercise for 30 minutes at least 5 days a week. Stop exercising if you experience uterine contractions.  Avoid heavy lifting, wear low heel shoes, and practice good posture.  A sexual relationship may be continued unless your health care provider directs you otherwise. Relieving pain and discomfort  Wear a good support bra to prevent discomfort from breast tenderness.  Take warm sitz baths to soothe any pain or discomfort caused by hemorrhoids. Use hemorrhoid cream if your health care provider approves.  Rest with your legs elevated if you have leg cramps or low back pain.  If you develop varicose veins, wear support hose. Elevate your feet for 15 minutes, 3-4 times a day. Limit salt in your diet. Prenatal Care  Write down your questions. Take them to your prenatal visits.  Keep all your prenatal visits as told by your health care provider. This is important. Safety  Wear your seat belt at all times when driving.  Make a list of emergency phone numbers, including numbers for family, friends, the hospital, and police and fire departments. General instructions  Ask your health care provider for a referral to a local prenatal education class. Begin classes no later than the beginning of month 6 of your pregnancy.  Ask for help if you have counseling or nutritional needs during pregnancy. Your health care provider can offer advice or refer you to specialists for help with various needs.  Do not use hot tubs, steam rooms, or saunas.  Do not douche or use  tampons or scented sanitary pads.  Do not cross your legs for long periods of time.  Avoid cat litter boxes and soil used by cats. These carry germs that can cause birth defects in the baby and possibly loss of the fetus by miscarriage or stillbirth.  Avoid all smoking, herbs, alcohol, and unprescribed drugs. Chemicals in these products can affect the formation and growth of the baby.  Do not use any products that contain nicotine or tobacco, such as cigarettes and e-cigarettes. If you need help quitting, ask your health care provider.  Visit your dentist if you have not gone yet during your pregnancy. Use a soft toothbrush to brush your teeth and be gentle when you floss. Contact a health care provider if:  You have dizziness.  You have mild pelvic cramps, pelvic pressure, or nagging pain in the abdominal area.  You have persistent nausea, vomiting, or diarrhea.  You have a bad smelling vaginal discharge.  You have pain when you urinate. Get help right away if:  You have a fever.  You are leaking fluid from your vagina.  You have spotting or bleeding from your vagina.  You have severe abdominal cramping or pain.  You have rapid weight gain or weight loss.  You have shortness of breath with chest pain.  You notice sudden or extreme swelling of your face, hands, ankles, feet, or legs.  You have not felt your baby move in over an hour.  You have severe headaches that do not go away when you take medicine.  You have vision changes. Summary  The second trimester is from week 14 through week 27 (months 4  through 6). It is also a time when the fetus is growing rapidly.  Your body goes through many changes during pregnancy. The changes vary from woman to woman.  Avoid all smoking, herbs, alcohol, and unprescribed drugs. These chemicals affect the formation and growth your baby.  Do not use any tobacco products, such as cigarettes, chewing tobacco, and e-cigarettes. If you  need help quitting, ask your health care provider.  Contact your health care provider if you have any questions. Keep all prenatal visits as told by your health care provider. This is important. This information is not intended to replace advice given to you by your health care provider. Make sure you discuss any questions you have with your health care provider. Document Released: 09/14/2001 Document Revised: 02/26/2016 Document Reviewed: 11/21/2012 Else[employer] Interactive Patient Education  2017 [employer]  1. Before your test, do not eat or drink anything for 8-10 hours prior to your  appointment (a small amount of water is allowed and you may take any medicines you normally take). Be sure to drink lots of water the day before. 2. When you arrive, your blood will be drawn for a 'fasting' blood sugar level.  Then you will be given a sweetened carbonated beverage to drink. You should  complete drinking this beverage within five minutes. After finishing the  beverage, you will have your blood drawn exactly 1 and 2 hours later. Having  your blood drawn on time is an important part of this test. A total of three blood  samples will be done. 3. The test takes approximately 2  hours. During the test, do not have anything to  eat or drink. Do not smoke, chew gum (not even sugarless gum) or use breath mints.  4. During the test you should remain close by and seated as much as possible and  avoid walking around. You may want to bring a book or something else to  occupy your time.  5. After your test, you may eat and drink as normal. You may want to bring a snack  to eat after the test is finished. Your provider will advise you as to the results of  this test and any follow-up if necessary  If your sugar test is positive for gestational diabetes, you will be given an phone call and further instructions discussed. If you wish to know all of your test results before your next appointment, feel free  to call the office, or look up your test results on Mychart.  (The range that the lab uses for normal values of the sugar test are not necessarily the range that is used for pregnant women; if your results are within the normal range, they are definitely normal.  However, if a value is deemed "high" by the lab, it may not be too high for a pregnant woman.  We will need to discuss the results if your value(s) fall in the "high" category).     Tdap Vaccine  It is recommended that you get the Tdap vaccine during the third trimester of EACH pregnancy to help protect your baby from getting pertussis (whooping cough)  27-36 weeks is the BEST time to do this so that you can pass the protection on to your baby. During pregnancy is better than after pregnancy, but if you are unable to get it during pregnancy it will be offered at the hospital.  You can get this vaccine at the health department or your family doctor, as well as some  pharmacies.  Everyone who will be around your baby should also be up-to-date on their vaccines. Adults (who are not pregnant) only need 1 dose of Tdap during adulthood.

## 2017-03-03 NOTE — Progress Notes (Addendum)
[id number] 4950w3d Estimated Date of Delivery: 07/04/17  Blood pressure 110/62, pulse 72, weight 160 lb (72.6 kg), last menstrual period 09/16/2016, currently breastfeeding.   BP weight and urine results all reviewed and noted.  Please refer to the obstetrical flow sheet for the fundal height and fetal heart rate documentation:  Patient reports good fetal movement, denies any bleeding and no rupture of membranes symptoms or regular contractions. Patient is without complaints. All questions were answered.  Orders Placed This Encounter  Procedures  . POCT urinalysis dipstick    Plan:  Continued routine obstetrical care,   Return in about 4 weeks (around 03/31/2017) for LROB, PN2.

## 2017-03-21 ENCOUNTER — Telehealth: Payer: Self-pay | Admitting: Obstetrics & Gynecology

## 2017-03-21 NOTE — Telephone Encounter (Signed)
Informed patient dental release form was faxed to [facility].

## 2017-03-21 NOTE — Telephone Encounter (Signed)
Patient has a dental appointment with [facility] today at 2:30 and needs a note stating it's ok for her to go to this visit. [facility] phone number is [phone], fax# [phone].Patient isn't sure what they are doing today because she states she has a lot of work that needs to be done.

## 2017-03-31 ENCOUNTER — Other Ambulatory Visit: Payer: Medicaid Other

## 2017-03-31 ENCOUNTER — Encounter: Payer: Medicaid Other | Admitting: Women's Health

## 2017-04-05 ENCOUNTER — Other Ambulatory Visit: Payer: Medicaid Other

## 2017-04-05 ENCOUNTER — Encounter: Payer: Self-pay | Admitting: Obstetrics and Gynecology

## 2017-04-05 ENCOUNTER — Ambulatory Visit (INDEPENDENT_AMBULATORY_CARE_PROVIDER_SITE_OTHER): Payer: Medicaid Other | Admitting: Obstetrics and Gynecology

## 2017-04-05 VITALS — BP 100/80 | HR 76 | Wt 164.2 lb

## 2017-04-05 DIAGNOSIS — Z3482 Encounter for supervision of other normal pregnancy, second trimester: Secondary | ICD-10-CM

## 2017-04-05 DIAGNOSIS — O34219 Maternal care for unspecified type scar from previous cesarean delivery: Secondary | ICD-10-CM

## 2017-04-05 DIAGNOSIS — Z1389 Encounter for screening for other disorder: Secondary | ICD-10-CM

## 2017-04-05 DIAGNOSIS — Z3A27 27 weeks gestation of pregnancy: Secondary | ICD-10-CM

## 2017-04-05 DIAGNOSIS — Z331 Pregnant state, incidental: Secondary | ICD-10-CM

## 2017-04-05 LAB — POCT URINALYSIS DIPSTICK
Blood, UA: NEGATIVE
Glucose, UA: NEGATIVE
Ketones, UA: NEGATIVE
Leukocytes, UA: NEGATIVE
Nitrite, UA: NEGATIVE
Protein, UA: NEGATIVE

## 2017-04-05 NOTE — Addendum Note (Signed)
Addended by: Colen DarlingYOUNG, Carianne Taira S on: 04/05/2017 10:17 AM   Modules accepted: Orders

## 2017-04-05 NOTE — Progress Notes (Signed)
[id number]  Estimated Date of Delivery: 07/04/17 LROB 5232w1d prior c/s x 2  Chief Complaint  Patient presents with  . Routine Prenatal Visit    PN2  ____  Patient complaints:None. Patient reports  good fetal movement,                           denies any bleeding , rupture of membranes,or regular contractions.  Blood pressure 100/80, pulse 76, weight 164 lb 3.2 oz (74.5 kg), last menstrual period 09/16/2016, currently breastfeeding.   Urine results:notable for negative protein and glucose refer to the ob flow sheet for FH and FHR, ,                          Physical Examination: General appearance - alert, well appearing, and in no distress                                      Abdomen - FH 25 ,                                                         -FHR of 148                                                                                               Pelvic -                                             Questions were answered. We'll plan on next  Assessment: LROB G3P2002 @ 5532w1d lower OB, repeat cesarean   Plan:  Continued routine obstetrical care, reschedule glucose tolerance test  F/u in 1 weeks for glucose tolerance test, 4 weeks low risk OB appointment

## 2017-04-05 NOTE — Consult Note (Signed)
Pt unable to keep glucose drink down. Pt was advised to bring Brach's jelly beans to lab for 1 hour GTT per Dr. Despina HiddenEure. Pt aware and verbalized understanding. JSY

## 2017-04-06 LAB — CBC
HEMOGLOBIN: 8.8 g/dL — AB (ref 11.1–15.9)
Hematocrit: 29.3 % — ABNORMAL LOW (ref 34.0–46.6)
MCH: 26.2 pg — AB (ref 26.6–33.0)
MCHC: 30 g/dL — ABNORMAL LOW (ref 31.5–35.7)
MCV: 87 fL (ref 79–97)
Platelets: 280 10*3/uL (ref 150–379)
RBC: 3.36 x10E6/uL — AB (ref 3.77–5.28)
RDW: 14.6 % (ref 12.3–15.4)
WBC: 8.2 10*3/uL (ref 3.4–10.8)

## 2017-04-06 LAB — RPR: RPR: NONREACTIVE

## 2017-04-06 LAB — ANTIBODY SCREEN: ANTIBODY SCREEN: NEGATIVE

## 2017-04-06 LAB — HIV ANTIBODY (ROUTINE TESTING W REFLEX): HIV SCREEN 4TH GENERATION: NONREACTIVE

## 2017-04-07 ENCOUNTER — Other Ambulatory Visit: Payer: Self-pay | Admitting: Women's Health

## 2017-04-07 ENCOUNTER — Telehealth: Payer: Self-pay | Admitting: *Deleted

## 2017-04-07 DIAGNOSIS — O99019 Anemia complicating pregnancy, unspecified trimester: Secondary | ICD-10-CM | POA: Insufficient documentation

## 2017-04-07 DIAGNOSIS — O99012 Anemia complicating pregnancy, second trimester: Secondary | ICD-10-CM

## 2017-04-07 MED ORDER — FERROUS SULFATE 325 (65 FE) MG PO TABS: 325.0000 mg | ORAL_TABLET | Freq: Two times a day (BID) | ORAL | 3 refills | Status: DC

## 2017-04-07 NOTE — Telephone Encounter (Signed)
Informed pt that her Hgb level was low and that Joellyn HaffKim Booker, CNM prescribed her Iron to take. I advised her to make sure she was taking her PNV and to increase her diet in iron-rich foods such as red meat, green leafy vegetables, and beans. Pt verbalized understanding.

## 2017-04-11 ENCOUNTER — Other Ambulatory Visit: Payer: Medicaid Other

## 2017-04-25 ENCOUNTER — Telehealth: Payer: Self-pay | Admitting: *Deleted

## 2017-04-25 NOTE — Telephone Encounter (Signed)
LMOVM that she needed 50grams before she has blood drawn and needs to bring package with her when she comes.

## 2017-04-26 ENCOUNTER — Other Ambulatory Visit: Payer: Medicaid Other

## 2017-04-27 LAB — GLUCOSE TOLERANCE, 1 HOUR: Glucose, 1Hr PP: 100 mg/dL (ref 65–199)

## 2017-05-02 ENCOUNTER — Encounter: Payer: Medicaid Other | Admitting: Women's Health

## 2017-05-25 ENCOUNTER — Ambulatory Visit (INDEPENDENT_AMBULATORY_CARE_PROVIDER_SITE_OTHER): Payer: Medicaid Other | Admitting: Women's Health

## 2017-05-25 VITALS — BP 98/48 | HR 102 | Wt 171.0 lb

## 2017-05-25 DIAGNOSIS — Z1389 Encounter for screening for other disorder: Secondary | ICD-10-CM

## 2017-05-25 DIAGNOSIS — O09893 Supervision of other high risk pregnancies, third trimester: Secondary | ICD-10-CM | POA: Diagnosis not present

## 2017-05-25 DIAGNOSIS — O360130 Maternal care for anti-D [Rh] antibodies, third trimester, not applicable or unspecified: Secondary | ICD-10-CM

## 2017-05-25 DIAGNOSIS — Z3A34 34 weeks gestation of pregnancy: Secondary | ICD-10-CM

## 2017-05-25 DIAGNOSIS — Z3483 Encounter for supervision of other normal pregnancy, third trimester: Secondary | ICD-10-CM

## 2017-05-25 DIAGNOSIS — O26899 Other specified pregnancy related conditions, unspecified trimester: Secondary | ICD-10-CM

## 2017-05-25 DIAGNOSIS — O0933 Supervision of pregnancy with insufficient antenatal care, third trimester: Secondary | ICD-10-CM

## 2017-05-25 DIAGNOSIS — Z331 Pregnant state, incidental: Secondary | ICD-10-CM

## 2017-05-25 DIAGNOSIS — O0930 Supervision of pregnancy with insufficient antenatal care, unspecified trimester: Secondary | ICD-10-CM | POA: Insufficient documentation

## 2017-05-25 DIAGNOSIS — Z6791 Unspecified blood type, Rh negative: Secondary | ICD-10-CM

## 2017-05-25 DIAGNOSIS — O99013 Anemia complicating pregnancy, third trimester: Secondary | ICD-10-CM

## 2017-05-25 LAB — POCT URINALYSIS DIPSTICK
GLUCOSE UA: NEGATIVE
Ketones, UA: NEGATIVE
Leukocytes, UA: NEGATIVE
Nitrite, UA: NEGATIVE
Protein, UA: NEGATIVE
RBC UA: NEGATIVE

## 2017-05-25 LAB — POCT HEMOGLOBIN: Hemoglobin: 8 g/dL — AB (ref 12.2–16.2)

## 2017-05-25 MED ORDER — RHO D IMMUNE GLOBULIN 1500 UNIT/2ML IJ SOSY
300.0000 ug | PREFILLED_SYRINGE | Freq: Once | INTRAMUSCULAR | Status: AC
  Administered 2017-05-25: 300 ug via INTRAMUSCULAR

## 2017-05-25 NOTE — Progress Notes (Signed)
Low-risk OB appointment [id number] [redacted]w[redacted]d Estimated Date of Delivery: 07/04/17 BP (!) 98/48   Pulse (!) 102   Wt 171 lb (77.6 kg)   LMP 09/16/2016 (Approximate)   BMI 33.40 kg/m   BP, weight, and urine reviewed.  Refer to obstetrical flow sheet for FH & FHR.  Reports good fm.  Denies regular uc's, lof, vb, or uti s/s. No complaints. NO care since 27wks, was 'changing houses and just got a car'. Just started taking Fe that was rx'd 7/5. Recheck today 8. To take Fe BID w/ OJ, increase fe-rich foods, will recheck again next visit. Discussed possible iron infusion. She is asymptomatic.  Plans RCS, needs scheduled>sent [facility] note Reviewed ptl s/s, fkc. Recommended Tdap at HD/PCP per CDC guidelines.  Plan:  Continue routine obstetrical care  F/U in 1wk for OB appointment w/ MD to schedule c/s, recheck Hgb Rhogam given today

## 2017-05-25 NOTE — Patient Instructions (Addendum)
Call the office [phone]) or go to [facility] if:  You begin to have strong, frequent contractions  Your water breaks.  Sometimes it is a big gush of fluid, sometimes it is just a trickle that keeps getting your panties wet or running down your legs  You have vaginal bleeding.  It is normal to have a small amount of spotting if your cervix was checked.   You don't feel your baby moving like normal.  If you don't, get you something to eat and drink and lay down and focus on feeling your baby move.  You should feel at least 10 movements in 2 hours.  If you don't, you should call the office or go to [facility].    Tdap Vaccine  It is recommended that you get the Tdap vaccine during the third trimester of EACH pregnancy to help protect your baby from getting pertussis (whooping cough)  27-36 weeks is the BEST time to do this so that you can pass the protection on to your baby. During pregnancy is better than after pregnancy, but if you are unable to get it during pregnancy it will be offered at the hospital.   You can get this vaccine at the health department or your family doctor  Everyone who will be around your baby should also be up-to-date on their vaccines. Adults (who are not pregnant) only need 1 dose of Tdap during adulthood.     Preterm Labor and Birth Information The normal length of a pregnancy is 39-41 weeks. Preterm labor is when labor starts before 37 completed weeks of pregnancy. What are the risk factors for preterm labor? Preterm labor is more likely to occur in women who:  Have certain infections during pregnancy such as a bladder infection, sexually transmitted infection, or infection inside the uterus (chorioamnionitis).  Have a shorter-than-normal cervix.  Have gone into preterm labor before.  Have had surgery on their cervix.  Are younger than age 41 or older than age 23.  Are African American.  Are pregnant with twins or multiple babies (multiple  gestation).  Take street drugs or smoke while pregnant.  Do not gain enough weight while pregnant.  Became pregnant shortly after having been pregnant.  What are the symptoms of preterm labor? Symptoms of preterm labor include:  Cramps similar to those that can happen during a menstrual period. The cramps may happen with diarrhea.  Pain in the abdomen or lower back.  Regular uterine contractions that may feel like tightening of the abdomen.  A feeling of increased pressure in the pelvis.  Increased watery or bloody mucus discharge from the vagina.  Water breaking (ruptured amniotic sac).  Why is it important to recognize signs of preterm labor? It is important to recognize signs of preterm labor because babies who are born prematurely may not be fully developed. This can put them at an increased risk for:  Long-term (chronic) heart and lung problems.  Difficulty immediately after birth with regulating body systems, including blood sugar, body temperature, heart rate, and breathing rate.  Bleeding in the brain.  Cerebral palsy.  Learning difficulties.  Death.  These risks are highest for babies who are born before 45 weeks of pregnancy. How is preterm labor treated? Treatment depends on the length of your pregnancy, your condition, and the health of your baby. It may involve:  Having a stitch (suture) placed in your cervix to prevent your cervix from opening too early (cerclage).  Taking or being given medicines,  such as: ? Hormone medicines. These may be given early in pregnancy to help support the pregnancy. ? Medicine to stop contractions. ? Medicines to help mature the baby's lungs. These may be prescribed if the risk of delivery is high. ? Medicines to prevent your baby from developing cerebral palsy.  If the labor happens before 34 weeks of pregnancy, you may need to stay in the hospital. What should I do if I think I am in preterm labor? If you think that you  are going into preterm labor, call your health care provider right away. How can I prevent preterm labor in future pregnancies? To increase your chance of having a full-term pregnancy:  Do not use any tobacco products, such as cigarettes, chewing tobacco, and e-cigarettes. If you need help quitting, ask your health care provider.  Do not use street drugs or medicines that have not been prescribed to you during your pregnancy.  Talk with your health care provider before taking any herbal supplements, even if you have been taking them regularly.  Make sure you gain a healthy amount of weight during your pregnancy.  Watch for infection. If you think that you might have an infection, get it checked right away.  Make sure to tell your health care provider if you have gone into preterm labor before.   Iron-Rich Diet Iron is a mineral that helps your body to produce hemoglobin. Hemoglobin is a protein in your red blood cells that carries oxygen to your body's tissues. Eating too little iron may cause you to feel weak and tired, and it can increase your risk for infection. Eating enough iron is necessary for your body's metabolism, muscle function, and nervous system. Iron is naturally found in many foods. It can also be added to foods or fortified in foods. There are two types of dietary iron:  Heme iron. Heme iron is absorbed by the body more easily than nonheme iron. Heme iron is found in meat, poultry, and fish.  Nonheme iron. Nonheme iron is found in dietary supplements, iron-fortified grains, beans, and vegetables.  You may need to follow an iron-rich diet if:  You have been diagnosed with iron deficiency or iron-deficiency anemia.  You have a condition that prevents you from absorbing dietary iron, such as: ? Infection in your intestines. ? Celiac disease. This involves long-lasting (chronic) inflammation of your intestines.  You do not eat enough iron.  You eat a diet that is high  in foods that impair iron absorption.  You have lost a lot of blood.  You have heavy bleeding during your menstrual cycle.  You are pregnant.  What is my plan? Your health care provider may help you to determine how much iron you need per day based on your condition. Generally, when a person consumes sufficient amounts of iron in the diet, the following iron needs are met:  Men. ? 66-52 years old: 11 mg per day. ? 75-24 years old: 8 mg per day.  Women. ? 23-72 years old: 15 mg per day. ? 43-5 years old: 18 mg per day. ? Over 87 years old: 8 mg per day. ? Pregnant women: 27 mg per day. ? Breastfeeding women: 9 mg per day.  What do I need to know about an iron-rich diet?  Eat fresh fruits and vegetables that are high in vitamin C along with foods that are high in iron. This will help increase the amount of iron that your body absorbs from food, especially with foods  containing nonheme iron. Foods that are high in vitamin C include oranges, peppers, tomatoes, and mango.  Take iron supplements only as directed by your health care provider. Overdose of iron can be life-threatening. If you were prescribed iron supplements, take them with orange juice or a vitamin C supplement.  Cook foods in pots and pans that are made from iron.  Eat nonheme iron-containing foods alongside foods that are high in heme iron. This helps to improve your iron absorption.  Certain foods and drinks contain compounds that impair iron absorption. Avoid eating these foods in the same meal as iron-rich foods or with iron supplements. These include: ? Coffee, black tea, and red wine. ? Milk, dairy products, and foods that are high in calcium. ? Beans, soybeans, and peas. ? Whole grains.  When eating foods that contain both nonheme iron and compounds that impair iron absorption, follow these tips to absorb iron better. ? Soak beans overnight before cooking. ? Soak whole grains overnight and drain them before  using. ? Ferment flours before baking, such as using yeast in bread dough. What foods can I eat? Grains Iron-fortified breakfast cereal. Iron-fortified whole-wheat bread. Enriched rice. Sprouted grains. Vegetables Spinach. Potatoes with skin. Green peas. Broccoli. Red and green bell peppers. Fermented vegetables. Fruits Prunes. Raisins. Oranges. Strawberries. Mango. Grapefruit. Meats and Other Protein Sources Beef liver. Oysters. Beef. Shrimp. [place]. Chicken. [place]. Sardines. Chickpeas. Nuts. Tofu. Beverages Tomato juice. Fresh orange juice. Prune juice. Hibiscus tea. Fortified instant breakfast shakes. Condiments Tahini. Fermented soy sauce. Sweets and Desserts Black-strap molasses. Other Wheat germ. The items listed above may not be a complete list of recommended foods or beverages. Contact your dietitian for more options. What foods are not recommended? Grains Whole grains. Bran cereal. Bran flour. Oats. Vegetables Artichokes. Brussels sprouts. Kale. Fruits Blueberries. Raspberries. Strawberries. Figs. Meats and Other Protein Sources Soybeans. Products made from soy protein. Dairy Milk. Cream. Cheese. Yogurt. Cottage cheese. Beverages Coffee. Black tea. Red wine. Sweets and Desserts Cocoa. Chocolate. Ice cream. Other Basil. Oregano. Parsley. The items listed above may not be a complete list of foods and beverages to avoid. Contact your dietitian for more information. This information is not intended to replace advice given to you by your health care provider. Make sure you discuss any questions you have with your health care provider. Document Released: 05/04/2005 Document Revised: 04/09/2016 Document Reviewed: 04/17/2014 Elsevier Interactive Patient Education  [employer].   This information is not intended to replace advice given to you by your health care provider. Make sure you discuss any questions you have with your health care provider. Document Released:  12/11/2003 Document Revised: 03/02/2016 Document Reviewed: 02/11/2016 Elsevier Interactive Patient Education  2018 [employer].

## 2017-05-26 ENCOUNTER — Other Ambulatory Visit: Payer: Self-pay | Admitting: Obstetrics and Gynecology

## 2017-05-26 NOTE — Progress Notes (Unsigned)
WILL adjust cesarean section for 39 weeks. Contraception plans to be determined

## 2017-06-01 ENCOUNTER — Ambulatory Visit (INDEPENDENT_AMBULATORY_CARE_PROVIDER_SITE_OTHER): Payer: Medicaid Other | Admitting: Obstetrics and Gynecology

## 2017-06-01 ENCOUNTER — Encounter: Payer: Self-pay | Admitting: Obstetrics and Gynecology

## 2017-06-01 VITALS — BP 102/50 | HR 76 | Wt 173.6 lb

## 2017-06-01 DIAGNOSIS — Z331 Pregnant state, incidental: Secondary | ICD-10-CM

## 2017-06-01 DIAGNOSIS — Z3A35 35 weeks gestation of pregnancy: Secondary | ICD-10-CM

## 2017-06-01 DIAGNOSIS — Z1389 Encounter for screening for other disorder: Secondary | ICD-10-CM

## 2017-06-01 DIAGNOSIS — Z3483 Encounter for supervision of other normal pregnancy, third trimester: Secondary | ICD-10-CM

## 2017-06-01 LAB — POCT URINALYSIS DIPSTICK
GLUCOSE UA: NEGATIVE
Ketones, UA: NEGATIVE
NITRITE UA: NEGATIVE
Protein, UA: NEGATIVE
RBC UA: NEGATIVE

## 2017-06-01 NOTE — Progress Notes (Signed)
Patient ID: Kaitlyn Fisher, female   DOB: 1996/09/07, 21 y.o.   MRN: [id number]  [facility]: [facility]  Chief Complaint  Patient presents with  . Routine Prenatal Visit  ____  Patient complaints: She would like Implanon after her c-section. Pt is scheduled for a c-section on 06/28/17.at 1 pm Patient reports good fetal movement, denies any bleeding, rupture of membranes, or regular contractions.  Blood pressure (!) 102/50, pulse 76, weight 173 lb 9.6 oz (78.7 kg), last menstrual period 09/16/2016, currently breastfeeding.   Urine results:notable for trace leukocytes, otherwise negative refer to the ob flow sheet for FH and FHR, ,                          Physical Examination: General appearance - alert, well appearing, and in no distress and oriented to person, place, and time                                      Abdomen - FH 35 cm                                                        -FHR 147       Questions were answered. Assessment: LROB [id number] @ 3332w2d   Plan:  Continued routine obstetrical care,  F/u in 2 weeks for LROB  By signing my name below, I, Diona BrownerJennifer Gorman, attest that this documentation has been prepared under the direction and in the presence of Tilda BurrowFerguson, Alaena Strader V, MD. Electronically Signed: Diona BrownerJennifer Gorman, Medical Scribe. 06/01/17. 2:44 PM.  I personally performed the services described in this documentation, which was SCRIBED in my presence. The recorded information has been reviewed and considered accurate. It has been edited as necessary during review. Tilda BurrowFERGUSON,Kortne All V, MD

## 2017-06-15 ENCOUNTER — Encounter: Payer: Medicaid Other | Admitting: Advanced Practice Midwife

## 2017-06-16 ENCOUNTER — Encounter: Payer: Medicaid Other | Admitting: Advanced Practice Midwife

## 2017-06-17 ENCOUNTER — Encounter: Payer: Self-pay | Admitting: Women's Health

## 2017-06-17 ENCOUNTER — Ambulatory Visit (INDEPENDENT_AMBULATORY_CARE_PROVIDER_SITE_OTHER): Payer: Medicaid Other | Admitting: Women's Health

## 2017-06-17 VITALS — BP 120/78 | HR 90 | Wt 177.5 lb

## 2017-06-17 DIAGNOSIS — D649 Anemia, unspecified: Secondary | ICD-10-CM | POA: Diagnosis not present

## 2017-06-17 DIAGNOSIS — Z1389 Encounter for screening for other disorder: Secondary | ICD-10-CM

## 2017-06-17 DIAGNOSIS — O99013 Anemia complicating pregnancy, third trimester: Secondary | ICD-10-CM

## 2017-06-17 DIAGNOSIS — Z331 Pregnant state, incidental: Secondary | ICD-10-CM | POA: Diagnosis not present

## 2017-06-17 DIAGNOSIS — Z3483 Encounter for supervision of other normal pregnancy, third trimester: Secondary | ICD-10-CM

## 2017-06-17 DIAGNOSIS — Z3A37 37 weeks gestation of pregnancy: Secondary | ICD-10-CM

## 2017-06-17 LAB — POCT URINALYSIS DIPSTICK
Blood, UA: NEGATIVE
Glucose, UA: NEGATIVE
KETONES UA: NEGATIVE
Leukocytes, UA: NEGATIVE
Nitrite, UA: NEGATIVE
PROTEIN UA: NEGATIVE

## 2017-06-17 LAB — POCT HEMOGLOBIN: Hemoglobin: 8.4 g/dL — AB (ref 12.2–16.2)

## 2017-06-17 NOTE — Progress Notes (Signed)
Low-risk OB appointment [id number] [redacted]w[redacted]d Estimated Date of Delivery: 07/04/17 BP 120/78   Pulse 90   Wt 177 lb 8 oz (80.5 kg)   LMP 09/16/2016 (Approximate)   BMI 34.67 kg/m   BP, weight, and urine reviewed.  Refer to obstetrical flow sheet for FH & FHR.  Reports good fm.  Denies regular uc's, lof, vb, or uti s/s. No complaints. Has been taking Fe BID, Hgb fingerstick today 8.4, asymptomatic GBS, gc/ct collected SVE per request: cl/th/high, vtx Reviewed labor s/s, fkc. Plan:  Continue routine obstetrical care  F/U in 1wk for OB appointment w/ [facility], has scheduled RCS 9/25 Order nexplanon today

## 2017-06-17 NOTE — Patient Instructions (Signed)
Call the office ([phone]) or go to [facility] if:  You begin to have strong, frequent contractions  Your water breaks.  Sometimes it is a big gush of fluid, sometimes it is just a trickle that keeps getting your panties wet or running down your legs  You have vaginal bleeding.  It is normal to have a small amount of spotting if your cervix was checked.   You don't feel your baby moving like normal.  If you don't, get you something to eat and drink and lay down and focus on feeling your baby move.  You should feel at least 10 movements in 2 hours.  If you don't, you should call the office or go to [facility].     Braxton Hicks Contractions Contractions of the uterus can occur throughout pregnancy, but they are not always a sign that you are in labor. You may have practice contractions called Braxton Hicks contractions. These false labor contractions are sometimes confused with true labor. What are Braxton Hicks contractions? Braxton Hicks contractions are tightening movements that occur in the muscles of the uterus before labor. Unlike true labor contractions, these contractions do not result in opening (dilation) and thinning of the cervix. Toward the end of pregnancy (32-34 weeks), Braxton Hicks contractions can happen more often and may become stronger. These contractions are sometimes difficult to tell apart from true labor because they can be very uncomfortable. You should not feel embarrassed if you go to the hospital with false labor. Sometimes, the only way to tell if you are in true labor is for your health care provider to look for changes in the cervix. The health care provider will do a physical exam and may monitor your contractions. If you are not in true labor, the exam should show that your cervix is not dilating and your water has not broken. If there are no prenatal problems or other health problems associated with your pregnancy, it is completely safe for you to be sent  home with false labor. You may continue to have Braxton Hicks contractions until you go into true labor. How can I tell the difference between true labor and false labor?  Differences ? False labor ? Contractions last 30-70 seconds.: Contractions are usually shorter and not as strong as true labor contractions. ? Contractions become very regular.: Contractions are usually irregular. ? Discomfort is usually felt in the top of the uterus, and it spreads to the lower abdomen and low back.: Contractions are often felt in the front of the lower abdomen and in the groin. ? Contractions do not go away with walking.: Contractions may go away when you walk around or change positions while lying down. ? Contractions usually become more intense and increase in frequency.: Contractions get weaker and are shorter-lasting as time goes on. ? The cervix dilates and gets thinner.: The cervix usually does not dilate or become thin. Follow these instructions at home:  Take over-the-counter and prescription medicines only as told by your health care provider.  Keep up with your usual exercises and follow other instructions from your health care provider.  Eat and drink lightly if you think you are going into labor.  If Braxton Hicks contractions are making you uncomfortable: ? Change your position from lying down or resting to walking, or change from walking to resting. ? Sit and rest in a tub of warm water. ? Drink enough fluid to keep your urine clear or pale yellow. Dehydration may cause these contractions. ?   Do slow and deep breathing several times an hour.  Keep all follow-up prenatal visits as told by your health care provider. This is important. Contact a health care provider if:  You have a fever.  You have continuous pain in your abdomen. Get help right away if:  Your contractions become stronger, more regular, and closer together.  You have fluid leaking or gushing from your vagina.  You  pass blood-tinged mucus (bloody show).  You have bleeding from your vagina.  You have low back pain that you never had before.  You feel your baby's head pushing down and causing pelvic pressure.  Your baby is not moving inside you as much as it used to. Summary  Contractions that occur before labor are called Braxton Hicks contractions, false labor, or practice contractions.  Braxton Hicks contractions are usually shorter, weaker, farther apart, and less regular than true labor contractions. True labor contractions usually become progressively stronger and regular and they become more frequent.  Manage discomfort from Braxton Hicks contractions by changing position, resting in a warm bath, drinking plenty of water, or practicing deep breathing. This information is not intended to replace advice given to you by your health care provider. Make sure you discuss any questions you have with your health care provider. Document Released: 09/20/2005 Document Revised: 08/09/2016 Document Reviewed: 08/09/2016 Elsevier Interactive Patient Education  2017 Elsevier Inc.  

## 2017-06-19 LAB — STREP GP B NAA: Strep Gp B NAA: POSITIVE — AB

## 2017-06-19 LAB — GC/CHLAMYDIA PROBE AMP
CHLAMYDIA, DNA PROBE: NEGATIVE
Neisseria gonorrhoeae by PCR: NEGATIVE

## 2017-06-20 ENCOUNTER — Encounter (HOSPITAL_COMMUNITY): Payer: Self-pay

## 2017-06-21 ENCOUNTER — Other Ambulatory Visit: Payer: Self-pay | Admitting: Obstetrics and Gynecology

## 2017-06-21 ENCOUNTER — Telehealth: Payer: Self-pay | Admitting: Obstetrics and Gynecology

## 2017-06-22 ENCOUNTER — Other Ambulatory Visit: Payer: Self-pay | Admitting: Obstetrics and Gynecology

## 2017-06-24 ENCOUNTER — Encounter: Payer: Medicaid Other | Admitting: Obstetrics and Gynecology

## 2017-06-24 NOTE — Telephone Encounter (Signed)
Phone call the patient who states that she "just forgot her appointment. She's not set up [facility] and she does not use that iPhone calendar abs for appointments yet. Strongly encouraged the patient to begin the sign up for [facility] so that her appointments in the baby's appointments or in her calendar automatically

## 2017-06-27 ENCOUNTER — Encounter (HOSPITAL_COMMUNITY): Payer: Self-pay

## 2017-06-27 ENCOUNTER — Encounter (HOSPITAL_COMMUNITY)
Admission: RE | Admit: 2017-06-27 | Discharge: 2017-06-27 | Disposition: A | Discharge status: Home / Self Care | Payer: Medicaid Other | Source: Ambulatory Visit | Attending: Obstetrics and Gynecology | Admitting: Obstetrics and Gynecology

## 2017-06-27 HISTORY — DX: Anemia, unspecified: D64.9

## 2017-06-27 LAB — CBC
HEMATOCRIT: 27 % — AB (ref 36.0–46.0)
HEMOGLOBIN: 7.8 g/dL — AB (ref 12.0–15.0)
MCH: 22.3 pg — ABNORMAL LOW (ref 26.0–34.0)
MCHC: 28.9 g/dL — AB (ref 30.0–36.0)
MCV: 77.1 fL — ABNORMAL LOW (ref 78.0–100.0)
Platelets: 204 10*3/uL (ref 150–400)
RBC: 3.5 MIL/uL — AB (ref 3.87–5.11)
RDW: 18.6 % — ABNORMAL HIGH (ref 11.5–15.5)
WBC: 8 10*3/uL (ref 4.0–10.5)

## 2017-06-27 NOTE — Anesthesia Preprocedure Evaluation (Addendum)
Anesthesia Evaluation  Patient identified by MRN, date of birth, ID band Patient awake    Reviewed: Allergy & Precautions, H&P , NPO status , Patient's Chart, lab work & pertinent test results  Airway Mallampati: I  TM Distance: >3 FB Neck ROM: full    Dental no notable dental hx.    Pulmonary neg pulmonary ROS,    Pulmonary exam normal        Cardiovascular negative cardio ROS Normal cardiovascular exam     Neuro/Psych PSYCHIATRIC DISORDERS negative neurological ROS     GI/Hepatic negative GI ROS, Neg liver ROS,   Endo/Other  negative endocrine ROS  Renal/GU negative Renal ROS     Musculoskeletal   Abdominal (+) + obese,   Peds  Hematology negative hematology ROS (+) anemia ,   Anesthesia Other Findings   Reproductive/Obstetrics (+) Pregnancy                            Anesthesia Physical  Anesthesia Plan  ASA: II  Anesthesia Plan: Spinal   Post-op Pain Management:    Induction:   PONV Risk Score and Plan: 2 and Ondansetron, Dexamethasone, Treatment may vary due to age or medical condition and Scopolamine patch - Pre-op  Airway Management Planned: Nasal Cannula  Additional Equipment:   Intra-op Plan:   Post-operative Plan:   Informed Consent: I have reviewed the patients History and Physical, chart, labs and discussed the procedure including the risks, benefits and alternatives for the proposed anesthesia with the patient or authorized representative who has indicated his/her understanding and acceptance.     Plan Discussed with: CRNA and Surgeon  Anesthesia Plan Comments:         Anesthesia Quick Evaluation

## 2017-06-27 NOTE — Pre-Procedure Instructions (Signed)
Dr Desmond Lope notified of hemoglobin 7.0.  No current orders received.

## 2017-06-27 NOTE — Patient Instructions (Signed)
20 Lyndi Holbein  06/27/2017   Your procedure is scheduled on:  06/28/2017  Enter through the Main Entrance of [facility] at 1200 PM.  Pick up the phone at the desk and dial [phone].   Call this number if you have problems the morning of surgery: [phone]   Remember:   Do not eat food:After Midnight.  Do not drink clear liquids: After Midnight.  Take these medicines the morning of surgery with A SIP OF WATER: none   Do not wear jewelry, make-up or nail polish.  Do not wear lotions, powders, or perfumes. Do not wear deodorant.  Do not shave 48 hours prior to surgery.  Do not bring valuables to the hospital.  [facility] is not   responsible for any belongings or valuables brought to the hospital.  Contacts, dentures or bridgework may not be worn into surgery.  Leave suitcase in the car. After surgery it may be brought to your room.  For patients admitted to the hospital, checkout time is 11:00 AM the day of              discharge.   Patients discharged the day of surgery will not be allowed to drive             home.  Name and phone number of your driver: na  Special Instructions:   N/A   Please read over the following fact sheets that you were given:   Surgical Site Infection Prevention

## 2017-06-28 ENCOUNTER — Inpatient Hospital Stay (HOSPITAL_COMMUNITY): Payer: Medicaid Other | Admitting: Anesthesiology

## 2017-06-28 ENCOUNTER — Other Ambulatory Visit: Payer: Self-pay | Admitting: Obstetrics and Gynecology

## 2017-06-28 ENCOUNTER — Inpatient Hospital Stay (HOSPITAL_COMMUNITY)
Admission: AD | Admit: 2017-06-28 | Discharge: 2017-06-30 | DRG: 766 | Disposition: A | Discharge status: Home / Self Care | Payer: Medicaid Other | Source: Ambulatory Visit | Attending: Obstetrics and Gynecology | Admitting: Obstetrics and Gynecology

## 2017-06-28 ENCOUNTER — Encounter (HOSPITAL_COMMUNITY)
Admission: AD | Disposition: A | Discharge status: Home / Self Care | Payer: Self-pay | Source: Ambulatory Visit | Attending: Obstetrics and Gynecology

## 2017-06-28 ENCOUNTER — Encounter (HOSPITAL_COMMUNITY): Payer: Self-pay | Admitting: *Deleted

## 2017-06-28 DIAGNOSIS — O99824 Streptococcus B carrier state complicating childbirth: Secondary | ICD-10-CM | POA: Diagnosis present

## 2017-06-28 DIAGNOSIS — Z3490 Encounter for supervision of normal pregnancy, unspecified, unspecified trimester: Secondary | ICD-10-CM

## 2017-06-28 DIAGNOSIS — Z3483 Encounter for supervision of other normal pregnancy, third trimester: Secondary | ICD-10-CM

## 2017-06-28 DIAGNOSIS — O34211 Maternal care for low transverse scar from previous cesarean delivery: Secondary | ICD-10-CM | POA: Diagnosis present

## 2017-06-28 DIAGNOSIS — Z98891 History of uterine scar from previous surgery: Secondary | ICD-10-CM

## 2017-06-28 DIAGNOSIS — O26899 Other specified pregnancy related conditions, unspecified trimester: Secondary | ICD-10-CM

## 2017-06-28 DIAGNOSIS — O34219 Maternal care for unspecified type scar from previous cesarean delivery: Secondary | ICD-10-CM | POA: Diagnosis present

## 2017-06-28 DIAGNOSIS — Z3A39 39 weeks gestation of pregnancy: Secondary | ICD-10-CM

## 2017-06-28 DIAGNOSIS — O9902 Anemia complicating childbirth: Secondary | ICD-10-CM | POA: Diagnosis present

## 2017-06-28 DIAGNOSIS — Z6791 Unspecified blood type, Rh negative: Secondary | ICD-10-CM

## 2017-06-28 DIAGNOSIS — D649 Anemia, unspecified: Secondary | ICD-10-CM | POA: Diagnosis present

## 2017-06-28 LAB — RAPID URINE DRUG SCREEN, HOSP PERFORMED
Amphetamines: NOT DETECTED
Barbiturates: NOT DETECTED
Benzodiazepines: NOT DETECTED
Cocaine: NOT DETECTED
OPIATES: NOT DETECTED
TETRAHYDROCANNABINOL: NOT DETECTED

## 2017-06-28 LAB — RPR: RPR Ser Ql: NONREACTIVE

## 2017-06-28 LAB — PREPARE RBC (CROSSMATCH)

## 2017-06-28 SURGERY — Surgical Case
Anesthesia: Spinal | Wound class: Clean Contaminated

## 2017-06-28 MED ORDER — ACETAMINOPHEN 325 MG PO TABS
650.0000 mg | ORAL_TABLET | ORAL | Status: DC | PRN
  Administered 2017-06-29: 650 mg via ORAL
  Filled 2017-06-28: qty 2

## 2017-06-28 MED ORDER — LACTATED RINGERS IV SOLN
INTRAVENOUS | Status: DC
  Administered 2017-06-28: 21:00:00 via INTRAVENOUS

## 2017-06-28 MED ORDER — IBUPROFEN 600 MG PO TABS
600.0000 mg | ORAL_TABLET | Freq: Four times a day (QID) | ORAL | Status: DC
  Administered 2017-06-28 – 2017-06-30 (×7): 600 mg via ORAL
  Filled 2017-06-28 (×7): qty 1

## 2017-06-28 MED ORDER — TETANUS-DIPHTH-ACELL PERTUSSIS 5-2.5-18.5 LF-MCG/0.5 IM SUSP: 0.5000 mL | Freq: Once | INTRAMUSCULAR | Status: DC

## 2017-06-28 MED ORDER — MENTHOL 3 MG MT LOZG: 1.0000 | LOZENGE | OROMUCOSAL | Status: DC | PRN

## 2017-06-28 MED ORDER — WITCH HAZEL-GLYCERIN EX PADS: 1.0000 "application " | MEDICATED_PAD | CUTANEOUS | Status: DC | PRN

## 2017-06-28 MED ORDER — OXYTOCIN 40 UNITS IN LACTATED RINGERS INFUSION - SIMPLE MED: 2.5000 [IU]/h | INTRAVENOUS | Status: AC

## 2017-06-28 MED ORDER — DEXAMETHASONE SODIUM PHOSPHATE 10 MG/ML IJ SOLN
INTRAMUSCULAR | Status: AC
  Filled 2017-06-28: qty 1

## 2017-06-28 MED ORDER — ONDANSETRON HCL 4 MG/2ML IJ SOLN
INTRAMUSCULAR | Status: AC
  Filled 2017-06-28: qty 2

## 2017-06-28 MED ORDER — MORPHINE SULFATE (PF) 0.5 MG/ML IJ SOLN
INTRAMUSCULAR | Status: DC | PRN
  Administered 2017-06-28: .2 mg via INTRATHECAL

## 2017-06-28 MED ORDER — OXYTOCIN 10 UNIT/ML IJ SOLN
INTRAVENOUS | Status: DC | PRN
  Administered 2017-06-28: 40 [IU] via INTRAVENOUS

## 2017-06-28 MED ORDER — PHENYLEPHRINE 8 MG IN D5W 100 ML (0.08MG/ML) PREMIX OPTIME
INJECTION | INTRAVENOUS | Status: AC
  Filled 2017-06-28: qty 100

## 2017-06-28 MED ORDER — LACTATED RINGERS IV SOLN
INTRAVENOUS | Status: DC | PRN
  Administered 2017-06-28 (×3): via INTRAVENOUS

## 2017-06-28 MED ORDER — PRENATAL MULTIVITAMIN CH
1.0000 | ORAL_TABLET | Freq: Every day | ORAL | Status: DC
  Administered 2017-06-29: 1 via ORAL
  Filled 2017-06-28: qty 1

## 2017-06-28 MED ORDER — SENNOSIDES-DOCUSATE SODIUM 8.6-50 MG PO TABS
2.0000 | ORAL_TABLET | ORAL | Status: DC
  Administered 2017-06-28 – 2017-06-29 (×2): 2 via ORAL
  Filled 2017-06-28 (×2): qty 2

## 2017-06-28 MED ORDER — PHENYLEPHRINE 8 MG IN D5W 100 ML (0.08MG/ML) PREMIX OPTIME
INJECTION | INTRAVENOUS | Status: DC | PRN
  Administered 2017-06-28: 80 ug/min via INTRAVENOUS

## 2017-06-28 MED ORDER — SIMETHICONE 80 MG PO CHEW
80.0000 mg | CHEWABLE_TABLET | ORAL | Status: DC | PRN
  Administered 2017-06-29: 80 mg via ORAL

## 2017-06-28 MED ORDER — FENTANYL CITRATE (PF) 100 MCG/2ML IJ SOLN
INTRAMUSCULAR | Status: AC
  Filled 2017-06-28: qty 2

## 2017-06-28 MED ORDER — ONDANSETRON HCL 4 MG/2ML IJ SOLN: 4.0000 mg | Freq: Once | INTRAMUSCULAR | Status: DC | PRN

## 2017-06-28 MED ORDER — CEFAZOLIN SODIUM-DEXTROSE 2-4 GM/100ML-% IV SOLN
2.0000 g | INTRAVENOUS | Status: AC
  Administered 2017-06-28: 2 g via INTRAVENOUS
  Filled 2017-06-28: qty 100

## 2017-06-28 MED ORDER — MORPHINE SULFATE (PF) 0.5 MG/ML IJ SOLN
INTRAMUSCULAR | Status: AC
  Filled 2017-06-28: qty 10

## 2017-06-28 MED ORDER — DIBUCAINE 1 % RE OINT: 1.0000 "application " | TOPICAL_OINTMENT | RECTAL | Status: DC | PRN

## 2017-06-28 MED ORDER — BUPIVACAINE IN DEXTROSE 0.75-8.25 % IT SOLN
INTRATHECAL | Status: AC
  Filled 2017-06-28: qty 2

## 2017-06-28 MED ORDER — FENTANYL CITRATE (PF) 100 MCG/2ML IJ SOLN
INTRAMUSCULAR | Status: DC | PRN
  Administered 2017-06-28: 25 ug via INTRATHECAL

## 2017-06-28 MED ORDER — ZOLPIDEM TARTRATE 5 MG PO TABS: 5.0000 mg | ORAL_TABLET | Freq: Every evening | ORAL | Status: DC | PRN

## 2017-06-28 MED ORDER — FENTANYL CITRATE (PF) 100 MCG/2ML IJ SOLN: 25.0000 ug | INTRAMUSCULAR | Status: DC | PRN

## 2017-06-28 MED ORDER — FENTANYL CITRATE (PF) 100 MCG/2ML IJ SOLN
INTRAMUSCULAR | Status: AC
  Administered 2017-06-28: 50 ug
  Filled 2017-06-28: qty 2

## 2017-06-28 MED ORDER — DEXAMETHASONE SODIUM PHOSPHATE 10 MG/ML IJ SOLN
INTRAMUSCULAR | Status: DC | PRN
  Administered 2017-06-28: 10 mg via INTRAVENOUS

## 2017-06-28 MED ORDER — ONDANSETRON HCL 4 MG/2ML IJ SOLN
INTRAMUSCULAR | Status: DC | PRN
  Administered 2017-06-28: 4 mg via INTRAVENOUS

## 2017-06-28 MED ORDER — SIMETHICONE 80 MG PO CHEW
80.0000 mg | CHEWABLE_TABLET | ORAL | Status: DC
  Administered 2017-06-28: 80 mg via ORAL
  Filled 2017-06-28: qty 1

## 2017-06-28 MED ORDER — COCONUT OIL OIL: 1.0000 "application " | TOPICAL_OIL | Status: DC | PRN

## 2017-06-28 MED ORDER — OXYTOCIN 10 UNIT/ML IJ SOLN
INTRAMUSCULAR | Status: AC
  Filled 2017-06-28: qty 4

## 2017-06-28 MED ORDER — BUPIVACAINE IN DEXTROSE 0.75-8.25 % IT SOLN
INTRATHECAL | Status: DC | PRN
  Administered 2017-06-28: 1.4 mL via INTRATHECAL

## 2017-06-28 MED ORDER — SIMETHICONE 80 MG PO CHEW
80.0000 mg | CHEWABLE_TABLET | Freq: Three times a day (TID) | ORAL | Status: DC
  Administered 2017-06-28 – 2017-06-30 (×4): 80 mg via ORAL
  Filled 2017-06-28 (×5): qty 1

## 2017-06-28 MED ORDER — MEPERIDINE HCL 25 MG/ML IJ SOLN: 6.2500 mg | INTRAMUSCULAR | Status: DC | PRN

## 2017-06-28 MED ORDER — DIPHENHYDRAMINE HCL 25 MG PO CAPS: 25.0000 mg | ORAL_CAPSULE | Freq: Four times a day (QID) | ORAL | Status: DC | PRN

## 2017-06-28 SURGICAL SUPPLY — 39 items
BENZOIN TINCTURE PRP APPL 2/3 (GAUZE/BANDAGES/DRESSINGS) ×3 IMPLANT
CELLS DAT CNTRL 66122 CELL SVR (MISCELLANEOUS) ×1 IMPLANT
CHLORAPREP W/TINT 26ML (MISCELLANEOUS) ×3 IMPLANT
CLAMP CORD UMBIL (MISCELLANEOUS) IMPLANT
CLOSURE WOUND 1/2 X4 (GAUZE/BANDAGES/DRESSINGS) ×1
CLOTH BEACON ORANGE TIMEOUT ST (SAFETY) ×3 IMPLANT
DRSG OPSITE POSTOP 4X10 (GAUZE/BANDAGES/DRESSINGS) ×3 IMPLANT
DRSG OPSITE POSTOP 4X12 (GAUZE/BANDAGES/DRESSINGS) ×3 IMPLANT
ELECT REM PT RETURN 9FT ADLT (ELECTROSURGICAL) ×3
ELECTRODE REM PT RTRN 9FT ADLT (ELECTROSURGICAL) ×1 IMPLANT
EXTRACTOR VACUUM KIWI (MISCELLANEOUS) IMPLANT
GLOVE BIO SURGEON ST LM GN SZ9 (GLOVE) ×3 IMPLANT
GLOVE BIOGEL PI IND STRL 7.0 (GLOVE) ×1 IMPLANT
GLOVE BIOGEL PI IND STRL 9 (GLOVE) ×1 IMPLANT
GLOVE BIOGEL PI INDICATOR 7.0 (GLOVE) ×2
GLOVE BIOGEL PI INDICATOR 9 (GLOVE) ×2
GOWN STRL REUS W/TWL 2XL LVL3 (GOWN DISPOSABLE) ×3 IMPLANT
GOWN STRL REUS W/TWL LRG LVL3 (GOWN DISPOSABLE) ×3 IMPLANT
NEEDLE HYPO 25X5/8 SAFETYGLIDE (NEEDLE) IMPLANT
NS IRRIG 1000ML POUR BTL (IV SOLUTION) ×3 IMPLANT
PACK C SECTION WH (CUSTOM PROCEDURE TRAY) ×3 IMPLANT
PAD OB MATERNITY 4.3X12.25 (PERSONAL CARE ITEMS) ×3 IMPLANT
PENCIL SMOKE EVAC W/HOLSTER (ELECTROSURGICAL) ×3 IMPLANT
RTRCTR C-SECT PINK 25CM LRG (MISCELLANEOUS) IMPLANT
RTRCTR C-SECT PINK 34CM XLRG (MISCELLANEOUS) IMPLANT
RTRCTR WOUND ALEXIS 18CM MED (MISCELLANEOUS) ×3
STRIP CLOSURE SKIN 1/2X4 (GAUZE/BANDAGES/DRESSINGS) ×2 IMPLANT
SUT MNCRL 0 VIOLET CTX 36 (SUTURE) ×2 IMPLANT
SUT MONOCRYL 0 CTX 36 (SUTURE) ×4
SUT PROLENE 1 CT (SUTURE) ×3 IMPLANT
SUT PROLENE 2 0 CT 30 (SUTURE) ×6 IMPLANT
SUT VIC AB 0 CT1 27 (SUTURE) ×2
SUT VIC AB 0 CT1 27XBRD ANBCTR (SUTURE) ×1 IMPLANT
SUT VIC AB 2-0 CT1 27 (SUTURE) ×2
SUT VIC AB 2-0 CT1 TAPERPNT 27 (SUTURE) ×1 IMPLANT
SUT VIC AB 4-0 KS 27 (SUTURE) ×3 IMPLANT
SYR BULB IRRIGATION 50ML (SYRINGE) IMPLANT
TOWEL OR 17X24 6PK STRL BLUE (TOWEL DISPOSABLE) ×3 IMPLANT
TRAY FOLEY BAG SILVER LF 14FR (SET/KITS/TRAYS/PACK) ×3 IMPLANT
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
## 2017-06-28 NOTE — Lactation Note (Signed)
This note was copied from a baby's chart. Lactation Consultation Note  Patient Name: Kaitlyn Fisher Today's Date: 06/28/2017 Reason for consult: Initial assessment   P3, Baby 10 hours old.  Mother states she did not breastfeed her first child but breastfed her second for 8 months. She states she wants to breastfeed but because she could not hand express she was worried and gave baby 30 ml of formula recently. Reviewed hand expression with mother and mother can easily express drops. She states baby latched after birth.   Provided mother with volume guidelines for formula. Recommend mother breastfeed before offering formula to help establish her milk supply. Mom encouraged to feed baby 8-12 times/24 hours and with feeding cues.  Suggest calling for latch assistance with RN tonight if needed. Mom made aware of O/P services, breastfeeding support groups, community resources, and our phone # for post-discharge questions.     Maternal Data Has patient been taught Hand Expression?: Yes Does the patient have breastfeeding experience prior to this delivery?: Yes  Feeding Feeding Type: Formula Nipple Type: Slow - flow  LATCH Score                   Interventions Interventions: Expressed milk;Hand express  Lactation Tools Discussed/Used     Consult Status Consult Status: Follow-up Date: 06/29/17 Follow-up type: In-patient    [name] [facility] 06/28/2017, 11:23 PM

## 2017-06-28 NOTE — Op Note (Signed)
Please see the brief operative note for surgical details 

## 2017-06-28 NOTE — Brief Op Note (Signed)
06/28/2017  2:11 PM  PATIENT:  Kaitlyn Fisher  21 y.o. female  PRE-OPERATIVE DIAGNOSIS:  Pregnancy 39 weeks, prior cesarean 2, for REPEAT C-SECTION  POST-OPERATIVE DIAGNOSIS: Pregnancy 39 weeks, prior cesarean section 2, delivered by REPEAT C-SECTION  PROCEDURE:  Procedure(s): REPEAT CESAREAN SECTION (N/A) low transverse cervical incision with 2 layer closure  SURGEON:  Surgeon(s) and Role:    Tilda Burrow, MD - Primary  PHYSICIAN ASSISTANT:   ASSISTANTS: Heather RNFA   ANESTHESIA:   spinal  EBL:  Total I/O In: 2500 [I.V.:2500] Out: 560 [Urine:150; Blood:410]  BLOOD ADMINISTERED:none  DRAINS: Urinary Catheter (Foley)   LOCAL MEDICATIONS USED:  NONE  SPECIMEN:  Source of Specimen:  Placenta to labor and delivery  DISPOSITION OF SPECIMEN:  N/A  COUNTS:  YES  TOURNIQUET:  * No tourniquets in log *  DICTATION: .Dragon Dictation  PLAN OF CARE: Admit to inpatient   PATIENT DISPOSITION:  PACU - hemodynamically stable.   Delay start of Pharmacological VTE agent (>24hrs) due to surgical blood loss or risk of bleeding: not applicable Details of procedure: Patient was taken operating room prepped and draped for lower abdominal surgery with timeout conducted Ancef administered and surgical procedure confirmed by operative team. 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 Fascia was opened transversely, peritoneal cavity entered in the midline without difficulty and Alexis wound retractor positioned for access to the uterus. The lower uterine segment was quite thin and was opened transversely just above the bladder, with clear fluid obtained and baby delivered by fundal pressure 2 management second stage involved early Pitocin use and the placenta was expelled spontaneously Kaitlyn Fisher presentation and minimal blood loss occurred. Cord blood samples were obtained. The baby  was pass to the waiting pediatricians.   Stasis was confirmed and the uterus closed in 2 layer closure, first layer running locking 0 Monocryl in continuous running second layer of 0 Monocryl. Was irrigated and hemostasis confirmed. Anterior peritoneum was closed running 2-0 Vicryl, fascia trimmed to reduce the redundancy and then closed with continuous running 0 Vicryl, subcutaneous tissues mobilized and fibrosis cut loose so that skin approximation was smooth. 20 plain was used for subcutaneous space and subcuticular 4-0 Vicryl skin closure needle counts were correct EBL measured 410 cc

## 2017-06-28 NOTE — Progress Notes (Signed)
Kaitlyn Fisher is a 21 y.o. female presenting for repeat cesarean section at 39 weeks after 2 prior cesareans. Prenatal course has been notable for persistent anemia, and patient has only recently begun to take Iron. Compliance with prenatal visits has been poor.UDS is ordered on arrival as precaution. OB History    Gravida Para Term Preterm AB Living   3 2 2     2   SAB TAB Ectopic Multiple Live Births         0 2     Past Medical History:  Diagnosis Date  . Anemia   . Depression    pp depression   Past Surgical History:  Procedure Laterality Date  . CESAREAN SECTION    . CESAREAN SECTION N/A 03/30/2016   Procedure: REPEAT CESAREAN SECTION;  Surgeon: Jermall Isaacson V Oluwatoyin Banales, MD;  Location: [facility];  Service: Obstetrics;  Laterality: N/A;  . NO PAST SURGERIES    . WISDOM TOOTH EXTRACTION     Family History: family history includes Asthma in her brother; Diabetes in her maternal grandmother; Heart disease in her maternal grandmother; Heart failure in her maternal grandmother. Social History:  reports that she has never smoked. She has never used smokeless tobacco. She reports that she does not drink alcohol or use drugs.     Maternal Diabetes: No Genetic Screening: Normal Maternal Ultrasounds/Referrals: Normal Fetal Ultrasounds or other Referrals:  None Maternal Substance Abuse:  No Significant Maternal Medications:  None Significant Maternal Lab Results:  Lab values include: Group B Strep positive Other Comments:  None  ROS History   Last menstrual period 09/16/2016, currently breastfeeding. Exam Physical Exam  Constitutional: She appears well-developed and well-nourished.  HENT:  Head: Normocephalic and atraumatic.  Eyes: Pupils are equal, round, and reactive to light.  Neck: Neck supple.  Cardiovascular: Normal rate.   Respiratory: Effort normal.  GI: Soft.  Genitourinary:  Genitourinary Comments: Gravid uterus consistent with dates. Low transverse abdominal  incision.    Prenatal labs: ABO, Rh: --/--/A NEG (09/24 1209) Antibody: NEG (09/24 1209) Rubella: 2.13 (04/04 1449) RPR: Non Reactive (09/24 1210)  HBsAg: Negative (04/04 1449)  HIV:    GBS: Positive (09/14 1330)   Assessment/Plan: Pregnancy 39 weeks.  Prior Cesarean x 2 for repeat cesarean. Anemia of pregnancy, chronic.  Plan : repeat cesarean section.   Zayde Stroupe V 06/28/2017, 7:46 AM    

## 2017-06-28 NOTE — Addendum Note (Signed)
Addendum  created 06/28/17 1903 by Elgie Congo, CRNA   Sign clinical note

## 2017-06-28 NOTE — Transfer of Care (Signed)
Immediate Anesthesia Transfer of Care Note  Patient: Kaitlyn Fisher  Procedure(s) Performed: Procedure(s): REPEAT CESAREAN SECTION (N/A)  Patient Location: PACU  Anesthesia Type:Spinal  Level of Consciousness: awake, alert , oriented and patient cooperative  Airway & Oxygen Therapy: Patient Spontanous Breathing  Post-op Assessment: Report given to RN and Post -op Vital signs reviewed and stable  Post vital signs: Reviewed and stable  Last Vitals:  Vitals:   06/28/17 1146  BP: 112/73  Pulse: 88  Resp: 16  Temp: 36.6 C    Last Pain:  Vitals:   06/28/17 1146  TempSrc: Oral      Patients Stated Pain Goal: 5 (06/28/17 1141)  Complications: No apparent anesthesia complications

## 2017-06-28 NOTE — H&P (View-Only) (Signed)
Kaitlyn Fisher is a 21 y.o. female presenting for repeat cesarean section at 39 weeks after 2 prior cesareans. Prenatal course has been notable for persistent anemia, and patient has only recently begun to take Iron. Compliance with prenatal visits has been poor.UDS is ordered on arrival as precaution. OB History    Gravida Para Term Preterm AB Living   SAB TAB Ectopic Multiple Live Births         0 2     Past Medical History:  Diagnosis Date  . Anemia   . Depression    pp depression   Past Surgical History:  Procedure Laterality Date  . CESAREAN SECTION    . CESAREAN SECTION N/A 03/30/2016   Procedure: REPEAT CESAREAN SECTION;  Surgeon: Tilda Burrow, MD;  Location: [facility];  Service: Obstetrics;  Laterality: N/A;  . NO PAST SURGERIES    . WISDOM TOOTH EXTRACTION     Family History: family history includes Asthma in her brother; Diabetes in her maternal grandmother; Heart disease in her maternal grandmother; Heart failure in her maternal grandmother. Social History:  reports that she has never smoked. She has never used smokeless tobacco. She reports that she does not drink alcohol or use drugs.     Maternal Diabetes: No Genetic Screening: Normal Maternal Ultrasounds/Referrals: Normal Fetal Ultrasounds or other Referrals:  None Maternal Substance Abuse:  No Significant Maternal Medications:  None Significant Maternal Lab Results:  Lab values include: Group B Strep positive Other Comments:  None  ROS History   Last menstrual period 09/16/2016, currently breastfeeding. Exam Physical Exam  Constitutional: She appears well-developed and well-nourished.  HENT:  Head: Normocephalic and atraumatic.  Eyes: Pupils are equal, round, and reactive to light.  Neck: Neck supple.  Cardiovascular: Normal rate.   Respiratory: Effort normal.  GI: Soft.  Genitourinary:  Genitourinary Comments: Gravid uterus consistent with dates. Low transverse abdominal  incision.    Prenatal labs: ABO, Rh: --/--/A NEG (09/24 1209) Antibody: NEG (09/24 1209) Rubella: 2.13 (04/04 1449) RPR: Non Reactive (09/24 1210)  HBsAg: Negative (04/04 1449)  HIV:    GBS: Positive (09/14 1330)   Assessment/Plan: Pregnancy 39 weeks.  Prior Cesarean x 2 for repeat cesarean. Anemia of pregnancy, chronic.  Plan : repeat cesarean section.   Lasandra Batley V 06/28/2017, 7:46 AM

## 2017-06-28 NOTE — Interval H&P Note (Signed)
History and Physical Interval Note:  06/28/2017 12:40 PM  Kaitlyn Fisher  has presented today for surgery, with the diagnosis of REPEAT C-SECTION  The various methods of treatment have been discussed with the patient and family. After consideration of risks, benefits and other options for treatment, the patient has consented to  Procedure(s): REPEAT CESAREAN SECTION (N/A) as a surgical intervention .  The patient's history has been reviewed, patient examined, no change in status, stable for surgery.  I have reviewed the patient's chart and labs.  Questions were answered to the patient's satisfaction.   CBC Latest Ref Rng & Units 06/27/2017 06/17/2017 05/25/2017  WBC 4.0 - 10.5 K/uL 8.0 - -  Hemoglobin 12.0 - 15.0 g/dL 7.8(L) 8.4(A) 8.0(A)  Hematocrit 36.0 - 46.0 % 27.0(L) - -  Platelets 150 - 400 K/uL 204 - -   Pt to OR for repeat cesarean thru old scar site. PP Birth control Nexplanon  Kaitlyn Fisher V

## 2017-06-28 NOTE — Anesthesia Postprocedure Evaluation (Signed)
Anesthesia Post Note  Patient: [name]  Procedure(s) Performed: Procedure(s) (LRB): REPEAT CESAREAN SECTION (N/A)     Patient location during evaluation: Mother Baby Anesthesia Type: Spinal Level of consciousness: awake and alert and oriented Pain management: pain level controlled Vital Signs Assessment: post-procedure vital signs reviewed and stable Respiratory status: spontaneous breathing and nonlabored ventilation Cardiovascular status: stable Postop Assessment: no headache, patient able to bend at knees, no backache, no apparent nausea or vomiting, spinal receding and adequate PO intake Anesthetic complications: no    Last Vitals:  Vitals:   06/28/17 1715 06/28/17 1815  BP: 123/73 102/61  Pulse: 67 67  Resp: 16 16  Temp: (!) 36 C (!) 36 C  SpO2: 100% 99%    Last Pain:  Vitals:   06/28/17 1815  TempSrc: Axillary  PainSc: 0-No pain   Pain Goal: Patients Stated Pain Goal: 5 (06/28/17 1141)               Jayleon Mcfarlane Hristova

## 2017-06-28 NOTE — Anesthesia Postprocedure Evaluation (Signed)
Anesthesia Post Note  Patient: Kaitlyn Fisher  Procedure(s) Performed: Procedure(s) (LRB): REPEAT CESAREAN SECTION (N/A)     Patient location during evaluation: PACU Anesthesia Type: Spinal Level of consciousness: oriented and awake and alert Pain management: pain level controlled Vital Signs Assessment: post-procedure vital signs reviewed and stable Respiratory status: spontaneous breathing, respiratory function stable and patient connected to nasal cannula oxygen Cardiovascular status: blood pressure returned to baseline and stable Postop Assessment: no headache, no backache and no apparent nausea or vomiting Anesthetic complications: no    Last Vitals:  Vitals:   06/28/17 1445 06/28/17 1449  BP: 124/65   Pulse: 72 82  Resp: 18 (!) 23  Temp:    SpO2: 98% 99%    Last Pain:  Vitals:   06/28/17 1146  TempSrc: Oral   Pain Goal: Patients Stated Pain Goal: 5 (06/28/17 1141)               Sircharles Holzheimer

## 2017-06-29 DIAGNOSIS — O34211 Maternal care for low transverse scar from previous cesarean delivery: Secondary | ICD-10-CM

## 2017-06-29 DIAGNOSIS — Z3A39 39 weeks gestation of pregnancy: Secondary | ICD-10-CM

## 2017-06-29 LAB — CBC
HEMATOCRIT: 26.3 % — AB (ref 36.0–46.0)
HEMOGLOBIN: 7.7 g/dL — AB (ref 12.0–15.0)
MCH: 22.3 pg — AB (ref 26.0–34.0)
MCHC: 29.3 g/dL — ABNORMAL LOW (ref 30.0–36.0)
MCV: 76.2 fL — AB (ref 78.0–100.0)
PLATELETS: 233 10*3/uL (ref 150–400)
RBC: 3.45 MIL/uL — AB (ref 3.87–5.11)
RDW: 18.5 % — ABNORMAL HIGH (ref 11.5–15.5)
WBC: 12.8 10*3/uL — AB (ref 4.0–10.5)

## 2017-06-29 LAB — BIRTH TISSUE RECOVERY COLLECTION (PLACENTA DONATION)

## 2017-06-29 MED ORDER — RHO D IMMUNE GLOBULIN 1500 UNIT/2ML IJ SOSY
300.0000 ug | PREFILLED_SYRINGE | Freq: Once | INTRAMUSCULAR | Status: AC
  Administered 2017-06-29: 300 ug via INTRAVENOUS
  Filled 2017-06-29: qty 2

## 2017-06-29 NOTE — Progress Notes (Signed)
CSW received consult for hx of Anxiety and Depression.  CSW met with MOB to offer support and complete assessment.    When CSW arrived, MOB was resting in bed and had 3 room guest present.  CSW offered to return at a later time however, MOB insisted on meeting with CSW now. MOB gave CSW permission to meet with MOB while MOB's guest were present.  CSW explained CSW's role and the need for CSW to meet with MOB.  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  MOB reported that MOB was dx with depression years ago (date unknown) and usually take Zoloft to manage her symtoms.  MOB communicated MOB discontinued the use of Zoloft in April and has had no symptoms of depression. CSW offered MOB resources for outpatient counseling and MOB declined.  MOB stated that MOB's mother will make MOB and appointment for counseling at [facility] after MOB d/c. CSW provided education regarding the baby blues period vs. perinatal mood disorders, discussed treatment and gave resources for mental health follow up if concerns arise.  CSW recommends self-evaluation during the postpartum time period using the New Mom Checklist from Postpartum Progress and encouraged MOB to contact a medical professional if symptoms are noted at any time.    CSW provided review of Sudden Infant Death Syndrome (SIDS) precautions.    CSW identifies no further need for intervention and no barriers to discharge at this time.  Gerlad Pelzel Boyd-Gilyard, MSW, LCSW Clinical Social Work [phone] 

## 2017-06-29 NOTE — Progress Notes (Signed)
Post Operative Day 1  Subjective:  Kaitlyn Fisher is a 21 y.o. [id number] [redacted]w[redacted]d s/p rLTCS.  No acute events overnight.  Pt denies problems with ambulating, voiding or po intake.  She denies nausea or vomiting.  Pain is well controlled.  She has had flatus. She has not had bowel movement.  Lochia Minimal.  Plan for birth control is Nexplanon.  Method of Feeding: breast  Objective: BP (!) 104/59 (BP Location: Left Arm)   Pulse 81   Temp 98.6 F (37 C) (Oral)   Resp 18   Ht 5' (1.524 m)   Wt 79.8 kg (176 lb)   LMP 09/16/2016 (Approximate)   SpO2 100%   BMI 34.37 kg/m   Physical Exam:  General: alert, cooperative and no distress Lochia:normal flow Chest: no increased work of breathing Abdomen: soft, nontender, fundus firm at/below umbilicus Uterine Fundus: firm DVT Evaluation: No evidence of DVT seen on physical exam. Extremities: No edema   Recent Labs  06/27/17 1210 06/29/17 0556  HGB 7.8* 7.7*  HCT 27.0* 26.3*    Assessment/Plan:  ASSESSMENT: Kaitlyn Fisher is a 21 y.o. G3P2002 [redacted]w[redacted]d pod #1 s/p rLTCS doing well.   Plan for discharge tomorrow, Breastfeeding, Lactation consult and Social Work consult   LOS: 1 day   Amanda C. Frances Furbish, MD PGY-1, [facility] 06/29/2017 10:19 AM

## 2017-06-30 ENCOUNTER — Encounter (HOSPITAL_COMMUNITY): Payer: Self-pay | Admitting: *Deleted

## 2017-06-30 LAB — RH IG WORKUP (INCLUDES ABO/RH)
ABO/RH(D): A NEG
FETAL SCREEN: NEGATIVE
Gestational Age(Wks): 39.1
UNIT DIVISION: 0

## 2017-06-30 MED ORDER — SENNOSIDES-DOCUSATE SODIUM 8.6-50 MG PO TABS: 2.0000 | ORAL_TABLET | Freq: Every evening | ORAL | 0 refills | Status: DC | PRN

## 2017-06-30 MED ORDER — IBUPROFEN 600 MG PO TABS: 600.0000 mg | ORAL_TABLET | Freq: Four times a day (QID) | ORAL | 0 refills | Status: DC

## 2017-06-30 NOTE — Discharge Summary (Signed)
OB Discharge Summary     Patient Name: Kaitlyn Fisher DOB: 05-01-1996 MRN: [id number]  Date of admission: 06/28/2017 Delivering MD: Tilda Burrow   Date of discharge: 06/30/2017  Admitting diagnosis: REPEAT C-SECTION Intrauterine pregnancy: [redacted]w[redacted]d     Secondary diagnosis:  Active Problems:   Rh negative state in antepartum period   Previous cesarean delivery affecting pregnancy, antepartum   Supervision of normal pregnancy   Status post repeat low transverse cesarean section  Additional problems: Anemia, Depression    Discharge diagnosis: Term Pregnancy Delivered                                                                                                Post partum procedures:rhogam  Augmentation: None  Complications: None  Hospital course:  Sceduled C/S   21 y.o. yo [id number] at [redacted]w[redacted]d was admitted to the hospital 06/28/2017 for scheduled cesarean section with the following indication:Elective Repeat.  Membrane Rupture Time/Date: 1:15 PM ,06/28/2017   Patient delivered a Viable infant.06/28/2017  Details of operation can be found in separate operative note.    Pateint had an uncomplicated postpartum course.  She is ambulating, tolerating a regular diet, passing flatus, and urinating well. Patient saw CSW due to h/o anxiety and depression. Given resources. No barriers to discharge.Patient is discharged home in stable condition on  07/01/17         Physical exam  Vitals:   06/29/17 0320 06/29/17 1002 06/29/17 1846 06/30/17 0626  BP: (!) 107/53 (!) 104/59 104/65 123/78  Pulse: 75 81 (!) 109 72  Resp: Temp: 98.6 F (37 C) 98.6 F (37 C) 98.2 F (36.8 C) 98.2 F (36.8 C)  TempSrc: Oral Oral Oral Oral  SpO2: 100%     Weight:      Height:       General: alert, cooperative and no distress Lochia: appropriate Uterine Fundus: firm Incision: Dressing is clean, dry, and intact DVT Evaluation: No evidence of DVT seen on physical exam. Labs: Lab Results   Component Value Date   WBC 12.8 (H) 06/29/2017   HGB 7.7 (L) 06/29/2017   HCT 26.3 (L) 06/29/2017   MCV 76.2 (L) 06/29/2017   PLT 233 06/29/2017   No flowsheet data found.  Discharge instruction: per After Visit Summary and "Baby and Me Booklet".  After visit meds:  Allergies as of 06/30/2017   No Known Allergies     Medication List    TAKE these medications   ferrous sulfate 325 (65 FE) MG tablet Take 1 tablet (325 mg total) by mouth 2 (two) times daily with a meal.   ibuprofen 600 MG tablet Commonly known as:  ADVIL,MOTRIN Take 1 tablet (600 mg total) by mouth every 6 (six) hours.   multivitamin-prenatal 27-0.8 MG Tabs tablet Take 1 tablet by mouth daily at 12 noon.   senna-docusate 8.6-50 MG tablet Commonly known as:  Senokot-S Take 2 tablets by mouth at bedtime as needed for mild constipation.            Discharge Care Instructions        Start  Ordered   06/30/17 0000  ibuprofen (ADVIL,MOTRIN) 600 MG tablet  Every 6 hours     06/30/17 0854   06/30/17 0000  senna-docusate (SENOKOT-S) 8.6-50 MG tablet  At bedtime PRN     06/30/17 0854      Diet: routine diet  Activity: Advance as tolerated. Pelvic rest for 6 weeks.   Outpatient follow up:2 weeks Follow up Appt:Future Appointments Date Time Provider Department Center  07/06/2017 9:45 AM Tilda Burrow, MD [facility]  07/28/2017 10:00 AM Cresenzo-Dishmon, Scarlette Calico, CNM [facility]    Postpartum contraception: Nexplanon  Newborn Data: Live born female  Birth Weight: 6 lb 2.8 oz (2800 g) APGAR: 8, 9  Baby Feeding: Bottle Disposition:home with mother  06/30/2017 Caryl Ada, DO

## 2017-06-30 NOTE — Lactation Note (Signed)
This note was copied from a baby's chart. Lactation Consultation Note  Patient Name: Girl Avarae Zwart Today's Date: 06/30/2017   Baby 44 hours old. Baby receiving a bottle when this LC entered the room, and mom reports that she no longer wants to BF.  Maternal Data    Feeding Feeding Type: Bottle Fed - Formula Nipple Type: Slow - flow  LATCH Score                   Interventions    Lactation Tools Discussed/Used     Consult Status      Sherlyn Hay 06/30/2017, 9:59 AM

## 2017-06-30 NOTE — Discharge Instructions (Signed)

## 2017-07-01 LAB — TYPE AND SCREEN
ABO/RH(D): A NEG
Antibody Screen: NEGATIVE
UNIT DIVISION: 0
Unit division: 0

## 2017-07-01 LAB — BPAM RBC
BLOOD PRODUCT EXPIRATION DATE: 201810102359
Blood Product Expiration Date: 201810092359
Unit Type and Rh: 600
Unit Type and Rh: 600

## 2017-07-06 ENCOUNTER — Ambulatory Visit (INDEPENDENT_AMBULATORY_CARE_PROVIDER_SITE_OTHER): Payer: Medicaid Other | Admitting: Obstetrics and Gynecology

## 2017-07-06 ENCOUNTER — Encounter: Payer: Self-pay | Admitting: Obstetrics and Gynecology

## 2017-07-06 VITALS — BP 110/68 | HR 99 | Ht 60.0 in | Wt 171.0 lb

## 2017-07-06 DIAGNOSIS — Z09 Encounter for follow-up examination after completed treatment for conditions other than malignant neoplasm: Secondary | ICD-10-CM

## 2017-07-06 DIAGNOSIS — Z9889 Other specified postprocedural states: Secondary | ICD-10-CM

## 2017-07-06 NOTE — Progress Notes (Signed)
Patient ID: Kaitlyn Fisher, female   DOB: [birth year], 21 y.o.   MRN: [id number]    Subjective:  Kaitlyn Fisher is a 21 y.o. female now 1 week status post Cesarean Section. She is pleased with the outcome of her procedure. She is also wondering when she can get the Nexplanon implant. Pt denies any associated symptoms at this time.  Review of Systems Negative   Diet:   normal   Bowel movements : normal.  The patient is not having any pain.  Objective:  BP 110/68 (BP Location: Right Arm, Patient Position: Sitting, Cuff Size: Normal)   Pulse 99   Ht 5' (1.524 m)   Wt 171 lb (77.6 kg)   BMI 33.40 kg/m  General:Well developed, well nourished.  No acute distress. Abdomen: Bowel sounds normal, soft, non-tender. Pelvic Exam: deferred   Abdominal Incision(s):   Healing well, no drainage, no erythema, no hernia, no swelling, no dehiscence,. Steri-Strips removed. Minimal to moderate edema of the lower abdominal pannus     Assessment:  Post-Op 1 week s/p Cesarean Section.  Healing well postoperatively.   Plan:  1.Wound care discussed   2. . current medications. 3. Activity restrictions: none 4. return to work: not applicable. 5. Follow up in 3 weeks for Nexplanon.  By signing my name below, I, Diona Browner, attest that this documentation has been prepared under the direction and in the presence of Tilda Burrow, MD. Electronically Signed: Diona Browner, Medical Scribe. 07/06/17. 11:36 AM.  I personally performed the services described in this documentation, which was SCRIBED in my presence. The recorded information has been reviewed and considered accurate. It has been edited as necessary during review. Tilda Burrow, MD

## 2017-07-28 ENCOUNTER — Ambulatory Visit: Payer: Medicaid Other | Admitting: Advanced Practice Midwife

## 2017-07-29 ENCOUNTER — Encounter: Payer: Medicaid Other | Admitting: Women's Health

## 2017-08-03 ENCOUNTER — Ambulatory Visit: Payer: Medicaid Other | Admitting: Advanced Practice Midwife

## 2017-08-03 ENCOUNTER — Encounter: Payer: Self-pay | Admitting: *Deleted

## 2017-08-08 ENCOUNTER — Ambulatory Visit: Payer: Medicaid Other | Admitting: Adult Health

## 2017-08-08 ENCOUNTER — Encounter: Payer: Self-pay | Admitting: *Deleted

## 2017-08-08 ENCOUNTER — Encounter: Payer: Medicaid Other | Admitting: Women's Health

## 2017-08-10 ENCOUNTER — Encounter: Payer: Medicaid Other | Admitting: Obstetrics and Gynecology

## 2017-08-10 ENCOUNTER — Encounter: Payer: Self-pay | Admitting: *Deleted

## 2017-10-18 ENCOUNTER — Ambulatory Visit (INDEPENDENT_AMBULATORY_CARE_PROVIDER_SITE_OTHER): Payer: Medicaid Other | Admitting: Advanced Practice Midwife

## 2017-10-18 ENCOUNTER — Encounter: Payer: Self-pay | Admitting: Advanced Practice Midwife

## 2017-10-18 VITALS — BP 118/78 | HR 90 | Ht 60.0 in | Wt 193.5 lb

## 2017-10-18 DIAGNOSIS — Z3202 Encounter for pregnancy test, result negative: Secondary | ICD-10-CM

## 2017-10-18 DIAGNOSIS — Z3049 Encounter for surveillance of other contraceptives: Secondary | ICD-10-CM | POA: Diagnosis not present

## 2017-10-18 DIAGNOSIS — Z30017 Encounter for initial prescription of implantable subdermal contraceptive: Secondary | ICD-10-CM | POA: Insufficient documentation

## 2017-10-18 LAB — POCT URINE PREGNANCY: Preg Test, Ur: NEGATIVE

## 2017-10-18 MED ORDER — ETONOGESTREL 68 MG ~~LOC~~ IMPL
68.0000 mg | DRUG_IMPLANT | Freq: Once | SUBCUTANEOUS | Status: AC
  Administered 2017-10-18: 68 mg via SUBCUTANEOUS

## 2017-10-18 NOTE — Addendum Note (Signed)
Addended by: Colen DarlingYOUNG, Khalani Novoa S on: 10/18/2017 11:49 AM   Modules accepted: Orders

## 2017-10-18 NOTE — Progress Notes (Signed)
  HPI:  Kaitlyn Fisher is a 22 y.o. year old African American female here for Nexplanon insertion.  Her LMP was 2 weeks ago , and her pregnancy test today was negative. Sttes that last intercourse was around 12/22, has not had sex since before the new year Risks/benefits/side effects of Nexplanon have been discussed and her questions have been answered.  Specifically, a failure rate of 10/998 has been reported, with an increased failure rate if pt takes St. John's Wort and/or antiseizure medicaitons.  Kaitlyn Fisher is aware of the common side effect of irregular bleeding, which the incidence of decreases over time.   Past Medical History: Past Medical History:  Diagnosis Date  . Anemia   . Depression    pp depression    Past Surgical History: Past Surgical History:  Procedure Laterality Date  . CESAREAN SECTION    . CESAREAN SECTION N/A 03/30/2016   Procedure: REPEAT CESAREAN SECTION;  Surgeon: Tilda BurrowJohn V Ferguson, MD;  [facility];  Service: Obstetrics;  Laterality: N/A;  . CESAREAN SECTION N/A 06/28/2017   Procedure: REPEAT CESAREAN SECTION;  Surgeon: Tilda BurrowFerguson, John V, MD;  [facility];  Service: Obstetrics;  Laterality: N/A;  . NO PAST SURGERIES    . WISDOM TOOTH EXTRACTION      Family History: Family History  Problem Relation Age of Onset  . Heart disease Maternal Grandmother   . Diabetes Maternal Grandmother   . Heart failure Maternal Grandmother   . Asthma Brother     Social History: Social History   Tobacco Use  . Smoking status: Never Smoker  . Smokeless tobacco: Never Used  Substance Use Topics  . Alcohol use: No  . Drug use: No    Allergies: No Known Allergies    Her left arm, approximatly 4 inches proximal from the elbow, was cleansed with alcohol and anesthetized with 2cc of 2% Lidocaine.  The area was cleansed again and the Nexplanon was inserted without difficulty.  A pressure bandage was applied.  Pt was instructed to remove  pressure bandage in a few hours, and keep insertion site covered with a bandaid for 3 days.  Back up contraception was recommended for 2 weeks.  Follow-up scheduled PRN problems  CRESENZO-DISHMAN,Tashaya Ancrum 10/18/2017 11:16 AM

## 2018-04-25 ENCOUNTER — Ambulatory Visit: Payer: Medicaid Other | Admitting: Women's Health

## 2018-04-25 ENCOUNTER — Encounter: Payer: Self-pay | Admitting: Women's Health

## 2018-04-25 VITALS — BP 103/65 | HR 84 | Ht 60.0 in | Wt 208.8 lb

## 2018-04-25 DIAGNOSIS — R635 Abnormal weight gain: Secondary | ICD-10-CM | POA: Diagnosis not present

## 2018-04-25 NOTE — Progress Notes (Signed)
   NEXPLANON REMOVAL Patient name: Kaitlyn Fisher MRN [id number]  Date of birth: 09-Jun-1996 Subjective Findings:   Kaitlyn Fisher is a 22 y.o. [id number] African American female being seen today for removal of a Nexplanon. Her Nexplanon was placed 10/18/17.  She desires removal because she thinks she has gained 50lbs since insertion. Review of chart reveals she was 193lb on 10/18/17 when inserted, is 208lb today, so 15lb total weight gain. States she is not eating like she should, eats fast food/junk food, sodas, juices, not exercising. Now that she realized only 15lb and likely r/t her habits and less likely Nexplanon, wants to leave it in.    No LMP recorded. Last pap-just turned 22yo. Results were:  n/a The planned method of family planning is nexplanon Pertinent History Reviewed:   Reviewed past medical,surgical, social, obstetrical and family history.  Reviewed problem list, medications and allergies. Objective Findings & Procedure:    Vitals:   04/25/18 1125  BP: 103/65  Pulse: 84  Weight: 208 lb 12.8 oz (94.7 kg)  Height: 5' (1.524 m)  Body mass index is 40.78 kg/m.  Assessment & Plan:   1) Weight gain w/ nexplanon-NOT REMOVED, only 15lb since insertion (not 50 as she thought), to decrease fast & junk foods/sodas/juices, increase fruits/vegs, lean meats, exercise, water No orders of the defined types were placed in this encounter.   Follow-up: Return in about 1 month (around 05/26/2018) for Pap & physical.  Cheral MarkerKimberly R Booker CNM, [facility]NP[facility]BC 04/25/2018 11:53 AM

## 2018-05-26 ENCOUNTER — Other Ambulatory Visit: Payer: Medicaid Other | Admitting: Women's Health

## 2018-06-01 ENCOUNTER — Other Ambulatory Visit: Payer: Medicaid Other | Admitting: Women's Health

## 2018-08-23 ENCOUNTER — Encounter: Payer: Medicaid Other | Admitting: Obstetrics and Gynecology

## 2018-10-27 ENCOUNTER — Encounter: Payer: Medicaid Other | Admitting: Women's Health

## 2018-10-27 ENCOUNTER — Encounter: Payer: Self-pay | Admitting: Women's Health

## 2018-11-23 ENCOUNTER — Encounter: Payer: Medicaid Other | Admitting: Women's Health

## 2018-11-29 ENCOUNTER — Ambulatory Visit: Payer: Medicaid Other | Admitting: Advanced Practice Midwife

## 2018-11-29 ENCOUNTER — Encounter: Payer: Self-pay | Admitting: Advanced Practice Midwife

## 2018-11-29 VITALS — BP 119/72 | HR 81 | Ht 60.0 in | Wt 204.0 lb

## 2018-11-29 DIAGNOSIS — Z3049 Encounter for surveillance of other contraceptives: Secondary | ICD-10-CM | POA: Diagnosis not present

## 2018-11-29 DIAGNOSIS — Z3202 Encounter for pregnancy test, result negative: Secondary | ICD-10-CM

## 2018-11-29 DIAGNOSIS — Z3046 Encounter for surveillance of implantable subdermal contraceptive: Secondary | ICD-10-CM

## 2018-11-29 LAB — POCT URINE PREGNANCY: PREG TEST UR: NEGATIVE

## 2018-11-29 MED ORDER — MISOPROSTOL 200 MCG PO TABS: ORAL_TABLET | ORAL | 2 refills | Status: DC

## 2018-11-29 NOTE — Progress Notes (Signed)
HPI:  Kaitlyn Fisher 23 y.o. here for Nexplanon removal. She got it placed a year ago and wants it out because "she feels fat".  Weight is actullay down from last visit by 4#, total weight gain is 10lbs.  Still wants it out.  Her future plans for birth control are Liletta.  Past Medical History: Past Medical History:  Diagnosis Date  . Anemia   . Depression    pp depression    Past Surgical History: Past Surgical History:  Procedure Laterality Date  . CESAREAN SECTION    . CESAREAN SECTION N/A 03/30/2016   Procedure: REPEAT CESAREAN SECTION;  Surgeon: Tilda Burrow, MD;  Location: [facility] BIRTHING SUITES;  Service: Obstetrics;  Laterality: N/A;  . CESAREAN SECTION N/A 06/28/2017   Procedure: REPEAT CESAREAN SECTION;  Surgeon: Tilda Burrow, MD;  Location: [facility] BIRTHING SUITES;  Service: Obstetrics;  Laterality: N/A;  . NO PAST SURGERIES    . WISDOM TOOTH EXTRACTION      Family History: Family History  Problem Relation Age of Onset  . Heart disease Maternal Grandmother   . Diabetes Maternal Grandmother   . Heart failure Maternal Grandmother   . Asthma Brother     Social History: Social History   Tobacco Use  . Smoking status: Never Smoker  . Smokeless tobacco: Never Used  Substance Use Topics  . Alcohol use: No  . Drug use: No    Allergies: No Known Allergies  Meds: (Not in a hospital admission)     Patient given informed consent for removal of her Nexplanon, time out was performed.  Signed copy in the chart.  Appropriate time out taken. Implanon site identified.  Area prepped in usual sterile fashon. One cc of 1% lidocaine was used to anesthetize the area at the distal end of the implant. A small stab incision was made right beside the implant on the distal portion.  The Nexplanon rod was grasped using hemostats and removed without difficulty.  There was less than 3 cc blood loss. There were no complications.  A small amount of antibiotic ointment and steri-strips were  applied over the small incision.  A pressure bandage was applied to reduce any bruising.  The patient tolerated the procedure well and was given post procedure instructions.

## 2019-04-13 ENCOUNTER — Emergency Department (HOSPITAL_COMMUNITY): Payer: Medicaid Other

## 2019-04-13 ENCOUNTER — Emergency Department (HOSPITAL_COMMUNITY)
Admission: EM | Admit: 2019-04-13 | Discharge: 2019-04-14 | Disposition: A | Discharge status: Home / Self Care | Payer: Medicaid Other | Attending: Emergency Medicine | Admitting: Emergency Medicine

## 2019-04-13 ENCOUNTER — Other Ambulatory Visit: Payer: Self-pay

## 2019-04-13 DIAGNOSIS — S59901A Unspecified injury of right elbow, initial encounter: Secondary | ICD-10-CM | POA: Diagnosis present

## 2019-04-13 DIAGNOSIS — S300XXA Contusion of lower back and pelvis, initial encounter: Secondary | ICD-10-CM | POA: Diagnosis not present

## 2019-04-13 DIAGNOSIS — S61001A Unspecified open wound of right thumb without damage to nail, initial encounter: Secondary | ICD-10-CM | POA: Diagnosis not present

## 2019-04-13 DIAGNOSIS — Y92410 Unspecified street and highway as the place of occurrence of the external cause: Secondary | ICD-10-CM | POA: Insufficient documentation

## 2019-04-13 DIAGNOSIS — M25551 Pain in right hip: Secondary | ICD-10-CM | POA: Diagnosis not present

## 2019-04-13 DIAGNOSIS — S0083XA Contusion of other part of head, initial encounter: Secondary | ICD-10-CM | POA: Insufficient documentation

## 2019-04-13 DIAGNOSIS — Y999 Unspecified external cause status: Secondary | ICD-10-CM | POA: Diagnosis not present

## 2019-04-13 DIAGNOSIS — R51 Headache: Secondary | ICD-10-CM | POA: Diagnosis not present

## 2019-04-13 DIAGNOSIS — S50311A Abrasion of right elbow, initial encounter: Secondary | ICD-10-CM | POA: Diagnosis not present

## 2019-04-13 DIAGNOSIS — Y9389 Activity, other specified: Secondary | ICD-10-CM | POA: Insufficient documentation

## 2019-04-13 DIAGNOSIS — S80811A Abrasion, right lower leg, initial encounter: Secondary | ICD-10-CM | POA: Diagnosis not present

## 2019-04-13 DIAGNOSIS — M7918 Myalgia, other site: Secondary | ICD-10-CM

## 2019-04-13 DIAGNOSIS — S80812A Abrasion, left lower leg, initial encounter: Secondary | ICD-10-CM | POA: Insufficient documentation

## 2019-04-13 DIAGNOSIS — T148XXA Other injury of unspecified body region, initial encounter: Secondary | ICD-10-CM

## 2019-04-13 LAB — I-STAT CHEM 8, ED
BUN: 5 mg/dL — ABNORMAL LOW (ref 6–20)
Calcium, Ion: 1.22 mmol/L (ref 1.15–1.40)
Chloride: 107 mmol/L (ref 98–111)
Creatinine, Ser: 0.8 mg/dL (ref 0.44–1.00)
Glucose, Bld: 105 mg/dL — ABNORMAL HIGH (ref 70–99)
HCT: 39 % (ref 36.0–46.0)
Hemoglobin: 13.3 g/dL (ref 12.0–15.0)
Potassium: 3.9 mmol/L (ref 3.5–5.1)
Sodium: 141 mmol/L (ref 135–145)
TCO2: 27 mmol/L (ref 22–32)

## 2019-04-13 LAB — URINALYSIS, ROUTINE W REFLEX MICROSCOPIC
Bilirubin Urine: NEGATIVE
Glucose, UA: NEGATIVE mg/dL
Ketones, ur: NEGATIVE mg/dL
Leukocytes,Ua: NEGATIVE
Nitrite: NEGATIVE
Protein, ur: NEGATIVE mg/dL
Specific Gravity, Urine: >1.046 — ABNORMAL HIGH (ref 1.005–1.030)
pH: 6 (ref 5.0–8.0)

## 2019-04-13 LAB — CBC
HCT: 38.4 % (ref 36.0–46.0)
Hemoglobin: 11.7 g/dL — ABNORMAL LOW (ref 12.0–15.0)
MCH: 27.7 pg (ref 26.0–34.0)
MCHC: 30.5 g/dL (ref 30.0–36.0)
MCV: 91 fL (ref 80.0–100.0)
Platelets: 305 10*3/uL (ref 150–400)
RBC: 4.22 MIL/uL (ref 3.87–5.11)
RDW: 15.1 % (ref 11.5–15.5)
WBC: 9.9 10*3/uL (ref 4.0–10.5)
nRBC: 0 % (ref 0.0–0.2)

## 2019-04-13 LAB — COMPREHENSIVE METABOLIC PANEL
ALT: 13 U/L (ref 0–44)
AST: 22 U/L (ref 15–41)
Albumin: 3.9 g/dL (ref 3.5–5.0)
Alkaline Phosphatase: 71 U/L (ref 38–126)
Anion gap: 10 (ref 5–15)
BUN: 5 mg/dL — ABNORMAL LOW (ref 6–20)
CO2: 24 mmol/L (ref 22–32)
Calcium: 9.3 mg/dL (ref 8.9–10.3)
Chloride: 107 mmol/L (ref 98–111)
Creatinine, Ser: 0.91 mg/dL (ref 0.44–1.00)
GFR calc Af Amer: >60 mL/min (ref >60)
GFR calc non Af Amer: >60 mL/min (ref >60)
Glucose, Bld: 108 mg/dL — ABNORMAL HIGH (ref 70–99)
Potassium: 3.9 mmol/L (ref 3.5–5.1)
Sodium: 141 mmol/L (ref 135–145)
Total Bilirubin: 0.6 mg/dL (ref 0.3–1.2)
Total Protein: 7.8 g/dL (ref 6.5–8.1)

## 2019-04-13 LAB — PROTIME-INR
INR: 0.9 (ref 0.8–1.2)
Prothrombin Time: 12.4 seconds (ref 11.4–15.2)

## 2019-04-13 LAB — ETHANOL: Alcohol, Ethyl (B): <10 mg/dL (ref <10)

## 2019-04-13 LAB — LACTIC ACID, PLASMA: Lactic Acid, Venous: 1.1 mmol/L (ref 0.5–1.9)

## 2019-04-13 LAB — I-STAT BETA HCG BLOOD, ED (MC, WL, AP ONLY): I-stat hCG, quantitative: <5 m[IU]/mL (ref <5)

## 2019-04-13 LAB — SAMPLE TO BLOOD BANK

## 2019-04-13 LAB — CDS SEROLOGY

## 2019-04-13 MED ORDER — MORPHINE SULFATE (PF) 2 MG/ML IV SOLN
2.0000 mg | Freq: Once | INTRAVENOUS | Status: AC
  Administered 2019-04-13: 2 mg via INTRAVENOUS
  Filled 2019-04-13: qty 1

## 2019-04-13 MED ORDER — SODIUM CHLORIDE 0.9 % IV BOLUS
1000.0000 mL | Freq: Once | INTRAVENOUS | Status: AC
  Administered 2019-04-13: 1000 mL via INTRAVENOUS

## 2019-04-13 MED ORDER — METHOCARBAMOL 500 MG PO TABS: 500.0000 mg | ORAL_TABLET | Freq: Two times a day (BID) | ORAL | 0 refills | Status: AC

## 2019-04-13 MED ORDER — IOHEXOL 300 MG/ML  SOLN
100.0000 mL | Freq: Once | INTRAMUSCULAR | Status: AC | PRN
  Administered 2019-04-13: 100 mL via INTRAVENOUS

## 2019-04-13 NOTE — ED Notes (Signed)
Patient transported to CT 
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
## 2019-04-13 NOTE — ED Notes (Signed)
Pt was able to ambulate around hall without any assistance.

## 2019-04-14 ENCOUNTER — Encounter (HOSPITAL_COMMUNITY): Payer: Self-pay
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
## 2019-04-26 ENCOUNTER — Telehealth: Payer: Medicaid Other

## 2019-07-20 ENCOUNTER — Other Ambulatory Visit: Payer: Self-pay | Admitting: Obstetrics & Gynecology

## 2019-07-20 DIAGNOSIS — O3680X0 Pregnancy with inconclusive fetal viability, not applicable or unspecified: Secondary | ICD-10-CM

## 2019-07-23 ENCOUNTER — Other Ambulatory Visit: Payer: Medicaid Other

## 2019-07-24 ENCOUNTER — Other Ambulatory Visit: Payer: Self-pay

## 2019-07-24 ENCOUNTER — Ambulatory Visit (INDEPENDENT_AMBULATORY_CARE_PROVIDER_SITE_OTHER): Payer: Medicaid Other

## 2019-07-24 DIAGNOSIS — Z3A01 Less than 8 weeks gestation of pregnancy: Secondary | ICD-10-CM

## 2019-07-24 DIAGNOSIS — O3680X0 Pregnancy with inconclusive fetal viability, not applicable or unspecified: Secondary | ICD-10-CM | POA: Diagnosis not present

## 2019-07-24 NOTE — Progress Notes (Signed)
[place] 6+2 wks,single IUP with enlarged YS,small subchorionic hemorrhage 1.8 x 1.3 x 1.6 cm,crl 5.78 mm,fhr 101 bpm,pt will come back in 10 days for f/u ultrasound per Anderson Malta

## 2019-08-03 ENCOUNTER — Other Ambulatory Visit: Payer: Self-pay | Admitting: Obstetrics & Gynecology

## 2019-08-03 DIAGNOSIS — O3680X0 Pregnancy with inconclusive fetal viability, not applicable or unspecified: Secondary | ICD-10-CM

## 2019-08-06 ENCOUNTER — Other Ambulatory Visit: Payer: Self-pay | Admitting: Obstetrics & Gynecology

## 2019-08-06 ENCOUNTER — Ambulatory Visit (INDEPENDENT_AMBULATORY_CARE_PROVIDER_SITE_OTHER): Payer: Medicaid Other

## 2019-08-06 ENCOUNTER — Encounter: Payer: Self-pay | Admitting: Obstetrics & Gynecology

## 2019-08-06 ENCOUNTER — Other Ambulatory Visit: Payer: Self-pay

## 2019-08-06 ENCOUNTER — Ambulatory Visit (INDEPENDENT_AMBULATORY_CARE_PROVIDER_SITE_OTHER): Payer: Medicaid Other | Admitting: Obstetrics & Gynecology

## 2019-08-06 VITALS — BP 140/93 | HR 95 | Ht 60.0 in | Wt 196.0 lb

## 2019-08-06 DIAGNOSIS — O021 Missed abortion: Secondary | ICD-10-CM | POA: Diagnosis not present

## 2019-08-06 DIAGNOSIS — Z3A01 Less than 8 weeks gestation of pregnancy: Secondary | ICD-10-CM | POA: Diagnosis not present

## 2019-08-06 DIAGNOSIS — O3680X0 Pregnancy with inconclusive fetal viability, not applicable or unspecified: Secondary | ICD-10-CM | POA: Diagnosis not present

## 2019-08-06 NOTE — Progress Notes (Signed)
[place] 6+2 wks single IUP,no fht,enlarged YS,subchorionic hemorrhage 2.5 x 1.3 x 1.2 cm,normal ovaries bilat,discussed results with Dr Elonda Husky

## 2019-08-06 NOTE — Progress Notes (Signed)
Follow up appointment for results  Chief Complaint  Patient presents with  . discuss [place]    Blood pressure (!) 140/93, pulse 95, height 5' (1.524 m), weight 196 lb (88.9 kg).  US Ob Comp Less 14 Wks  Result Date: 08/06/2019 FOLLOW UP SONOGRAM Kaitlyn Fisher is in the office for a follow up sonogram for viability. She is a 23 y.o. year old G59P3003 with Estimated Date of Delivery: 03/16/2020 by early ultrasound now at  8+[redacted] weeks gestation. Thus far the pregnancy has been complicated by enlarged YS,bradycardia. GESTATION: SINGLETON FETAL ACTIVITY:          Heart rate         No fht         CERVIX: Appears closed ADNEXA: The ovaries are normal. GESTATIONAL AGE AND  BIOMETRICS: Gestational criteria: Estimated Date of Delivery: 03/16/2020 by early ultrasound now at 8+1 wks Previous Scans:1 GESTATIONAL SAC           25.5 mm         7+4 weeks CROWN RUMP LENGTH           5.77 mm         6+2 weeks                                                                      AVERAGE EGA(BY THIS SCAN):  6+2 weeks                                            SUSPECTED ABNORMALITIES:  yes,no fht,enlarged YS,subchorionic hemorrhage QUALITY OF SCAN: satisfactory TECHNICIAN COMMENTS: [place] 6+2 wks single IUP,no fht,enlarged YS,subchorionic hemorrhage 2.5 x 1.3 x 1.2 cm,normal ovaries bilat,discussed results with Dr Zigmund Gottron Kaitlyn Fisher A copy of this report including all images has been saved and backed up to a second source for retrieval if needed. All measures and details of the anatomical scan, placentation, fluid volume and pelvic anatomy are contained in that report. Kaitlyn Fisher 08/06/2019 4:25 PM Clinical Impression and recommendations: I have reviewed the sonogram results above, combined with the patient's current clinical course, below are my impressions and any appropriate recommendations for management based on the sonographic findings. Comparison study 07/24/2019: Non viable early IUP, with no  interval CRL growth and now  absence of FCA [id number] Estimated Date of Delivery: 03/16/20 Normal general sonographic findings Recommend routine care unless otherwise clinically indicated This recommendation follows SRU consensus guidelines: Diagnostic Criteria for Nonviable Pregnancy Early in the First Trimester. Malva Limes Med 2013; 638:7564-33. Amaryllis Dyke Eure 08/06/2019 4:41 PM   US Ob Transvaginal  Result Date: 08/06/2019 FOLLOW UP SONOGRAM Kaitlyn Fisher is in the office for a follow up sonogram for viability. She is a 23 y.o. year old G19P3003 with Estimated Date of Delivery: 03/16/2020 by early ultrasound now at  8+[redacted] weeks gestation. Thus far the pregnancy has been complicated by enlarged YS,bradycardia. GESTATION: SINGLETON FETAL ACTIVITY:          Heart rate         No fht         CERVIX: Appears closed ADNEXA: The ovaries  are normal. GESTATIONAL AGE AND  BIOMETRICS: Gestational criteria: Estimated Date of Delivery: 03/16/2020 by early ultrasound now at 8+1 wks Previous Scans:1 GESTATIONAL SAC           25.5 mm         7+4 weeks CROWN RUMP LENGTH           5.77 mm         6+2 weeks                                                                      AVERAGE EGA(BY THIS SCAN):  6+2 weeks                                            SUSPECTED ABNORMALITIES:  yes,no fht,enlarged YS,subchorionic hemorrhage QUALITY OF SCAN: satisfactory TECHNICIAN COMMENTS: [place] 6+2 wks single IUP,no fht,enlarged YS,subchorionic hemorrhage 2.5 x 1.3 x 1.2 cm,normal ovaries bilat,discussed results with Dr Nelma Rothman Kaitlyn Fisher A copy of this report including all images has been saved and backed up to a second source for retrieval if needed. All measures and details of the anatomical scan, placentation, fluid volume and pelvic anatomy are contained in that report. Kaitlyn Fisher 08/06/2019 4:25 PM Clinical Impression and recommendations: I have reviewed the sonogram results above, combined with the patient's current clinical course, below are my impressions and any  appropriate recommendations for management based on the sonographic findings. Comparison study 07/24/2019: Non viable early IUP, with no  interval CRL growth and now absence of FCA [id number] Estimated Date of Delivery: 03/16/20 Normal general sonographic findings Recommend routine care unless otherwise clinically indicated This recommendation follows SRU consensus guidelines: Diagnostic Criteria for Nonviable Pregnancy Early in the First Trimester. Alta Corning Med 2013; [id number]:9371-69. Florian Buff 08/06/2019 4:41 PM      MEDS ordered this encounter: Meds ordered this encounter  Medications  . misoprostol (CYTOTEC) 200 MCG tablet    Sig: Take all 4 tablets at once.  Repeat in 3 days if not effective    Dispense:  4 tablet    Refill:  1  . HYDROcodone-acetaminophen (NORCO/VICODIN) 5-325 MG tablet    Sig: Take 1 tablet by mouth every 6 (six) hours as needed.    Dispense:  15 tablet    Refill:  0    Orders for this encounter: No orders of the defined types were placed in this encounter.   Impression: 1. Missed ab     Plan: cytotec 800 micrograms, refill if needed  Follow Up: Return in about 3 weeks (around 08/27/2019) for Follow up, CNM ok.       Face to face time:  15 minutes  Greater than 50% of the visit time was spent in counseling and coordination of care with the patient.  The summary and outline of the counseling and care coordination is summarized in the note above.   All questions were answered.  Past Medical History:  Diagnosis Date  . Anemia   . Depression    pp depression    Past Surgical History:  Procedure Laterality Date  . CESAREAN SECTION    .  CESAREAN SECTION N/A 03/30/2016   Procedure: REPEAT CESAREAN SECTION;  Surgeon: Tilda BurrowJohn V Ferguson, MD;  [facility];  Service: Obstetrics;  Laterality: N/A;  . CESAREAN SECTION N/A 06/28/2017   Procedure: REPEAT CESAREAN SECTION;  Surgeon: Tilda BurrowFerguson, John V, MD;  [facility];  Service:  Obstetrics;  Laterality: N/A;  . NO PAST SURGERIES    . WISDOM TOOTH EXTRACTION      OB History    Gravida  4   Para  3   Term  3   Preterm      AB      Living  3     SAB      TAB      Ectopic      Multiple  0   Live Births  3           No Known Allergies  Social History   Socioeconomic History  . Marital status: Single    Spouse name: Not on file  . Number of children: Not on file  . Years of education: Not on file  . Highest education level: Not on file  Occupational History  . Not on file  Social Needs  . Financial resource strain: Not on file  . Food insecurity    Worry: Not on file    Inability: Not on file  . Transportation needs    Medical: Not on file    Non-medical: Not on file  Tobacco Use  . Smoking status: Never Smoker  . Smokeless tobacco: Never Used  Substance and Sexual Activity  . Alcohol use: No  . Drug use: No  . Sexual activity: Yes    Birth control/protection: None  Lifestyle  . Physical activity    Days per week: Not on file    Minutes per session: Not on file  . Stress: Not on file  Relationships  . Social Musicianconnections    Talks on phone: Not on file    Gets together: Not on file    Attends religious service: Not on file    Active member of club or organization: Not on file    Attends meetings of clubs or organizations: Not on file    Relationship status: Not on file  Other Topics Concern  . Not on file  Social History Narrative  . Not on file    Family History  Problem Relation Age of Onset  . Heart disease Maternal Grandmother   . Diabetes Maternal Grandmother   . Heart failure Maternal Grandmother   . Asthma Brother

## 2019-08-07 MED ORDER — MISOPROSTOL 200 MCG PO TABS: ORAL_TABLET | ORAL | 1 refills | Status: DC

## 2019-08-07 MED ORDER — HYDROCODONE-ACETAMINOPHEN 5-325 MG PO TABS: 1.0000 | ORAL_TABLET | Freq: Four times a day (QID) | ORAL | 0 refills | Status: DC | PRN

## 2019-08-27 ENCOUNTER — Encounter: Payer: Self-pay | Admitting: *Deleted

## 2019-08-27 ENCOUNTER — Ambulatory Visit (INDEPENDENT_AMBULATORY_CARE_PROVIDER_SITE_OTHER): Payer: Medicaid Other | Admitting: Women's Health

## 2019-08-27 ENCOUNTER — Other Ambulatory Visit: Payer: Self-pay

## 2019-08-27 VITALS — BP 113/79 | HR 70 | Temp 99.1°F | Ht 61.0 in | Wt 191.0 lb

## 2019-08-27 DIAGNOSIS — O021 Missed abortion: Secondary | ICD-10-CM

## 2019-08-27 DIAGNOSIS — O039 Complete or unspecified spontaneous abortion without complication: Secondary | ICD-10-CM | POA: Diagnosis not present

## 2019-08-27 MED ORDER — DOXYCYCLINE HYCLATE 100 MG PO CAPS: 100.0000 mg | ORAL_CAPSULE | Freq: Two times a day (BID) | ORAL | 0 refills | Status: DC

## 2019-08-27 NOTE — Progress Notes (Signed)
   GYN VISIT Patient name: Kaitlyn Fisher MRN [id number]  Date of birth: 25-Mar-1996 Chief Complaint:   Follow up missed AB  History of Present Illness:   Kaitlyn Fisher is a 23 y.o. G36P3003 African American female being seen today for SAB f/u. Dx on 08/06/19 w/ CRL c/w [redacted]w[redacted]d and no FCA. Rx'd cytotec 81mcg to repeat in 3d if needed. Stated she took the dose that day, nothing happened, she did not repeat the dose. Started bleeding heavy last night, passing clots, went to [facility] ED last night, has u/s showed GS in LUS. BHCG 4,461, WBC 13.3, Hgb 11.4, temp 98.1, was given rhogam. Had chills last night. Cramping still some today, but bleeding has slowed. Does want another pregnancy after this one.   No LMP recorded (exact date).  Last pap never. Results were:  n/a Review of Systems:   Pertinent items are noted in HPI Denies fever/chills, dizziness, headaches, visual disturbances, fatigue, shortness of breath, chest pain, abdominal pain, vomiting, abnormal vaginal discharge/itching/odor/irritation, problems with periods, bowel movements, urination, or intercourse unless otherwise stated above.  Pertinent History Reviewed:  Reviewed past medical,surgical, social, obstetrical and family history.  Reviewed problem list, medications and allergies. Physical Assessment:   Vitals:   08/27/19 1506  BP: 113/79  Pulse: 70  Temp: 99.1 F (37.3 C)  Weight: 191 lb (86.6 kg)  Height: 5\' 1"  (1.549 m)  Body mass index is 36.09 kg/m.       Physical Examination:   General appearance: alert, well appearing, and in no distress  Mental status: alert, oriented to person, place, and time  Skin: warm & dry   Cardiovascular: normal heart rate noted  Respiratory: normal respiratory effort, no distress  Abdomen: soft, non-tender   Extremities: no edema   Informal TA u/s w/ JVF assistance, GS appears to be in cervix  Pelvic by Maryagnes Amos, SNM, GS protruding through cx, gently removed w/ ring forceps, intact,  very foul smelling. Placed in specimen cup to send to pathology.  Chaperone: me    No results found for this or any previous visit (from the past 24 hour(s)).  Assessment & Plan:  1) Completed Ab> temp 99.1, wbc 13 <24hrs ago, foul smelling GS, discussed w/ JVF, rx doxycycline. F/U 2wks. Reviewed warning s/s, reasons to seek care. Rh neg, received Rhogam last night in ED.   Meds:  Meds ordered this encounter  Medications  . doxycycline (VIBRAMYCIN) 100 MG capsule    Sig: Take 1 capsule (100 mg total) by mouth 2 (two) times daily. X 2 weeks    Dispense:  28 capsule    Refill:  0    Order Specific Question:   Supervising Provider    Answer:   Elonda Husky, LUTHER H [[id number]]    No orders of the defined types were placed in this encounter.   Return in about 2 weeks (around 09/10/2019).  [place], [facility] 08/27/2019 4:32 PM

## 2019-08-27 NOTE — Patient Instructions (Signed)
No sex until after next visit  Miscarriage A miscarriage is the loss of an unborn baby (fetus) before the 20th week of pregnancy. Most miscarriages happen during the first 3 months of pregnancy. Sometimes, a miscarriage can happen before a woman knows that she is pregnant. Having a miscarriage can be an emotional experience. If you have had a miscarriage, talk with your health care provider about any questions you may have about miscarrying, the grieving process, and your plans for future pregnancy. What are the causes? A miscarriage may be caused by:  Problems with the genes or chromosomes of the fetus. These problems make it impossible for the baby to develop normally. They are often the result of random errors that occur early in the development of the baby, and are not passed from parent to child (not inherited).  Infection of the cervix or uterus.  Conditions that affect hormone balance in the body.  Problems with the cervix, such as the cervix opening and thinning before pregnancy is at term (cervical insufficiency).  Problems with the uterus. These may include: ? A uterus with an abnormal shape. ? Fibroids in the uterus. ? Congenital abnormalities. These are problems that were present at birth.  Certain medical conditions.  Smoking, drinking alcohol, or using drugs.  Injury (trauma). In many cases, the cause of a miscarriage is not known. What are the signs or symptoms? Symptoms of this condition include:  Vaginal bleeding or spotting, with or without cramps or pain.  Pain or cramping in the abdomen or lower back.  Passing fluid, tissue, or blood clots from the vagina. How is this diagnosed? This condition may be diagnosed based on:  A physical exam.  Ultrasound.  Blood tests.  Urine tests. How is this treated? Treatment for a miscarriage is sometimes not necessary if you naturally pass all the tissue that was in your uterus. If necessary, this condition may be  treated with:  Dilation and curettage (D&C). This is a procedure in which the cervix is stretched open and the lining of the uterus (endometrium) is scraped. This is done only if tissue from the fetus or placenta remains in the body (incomplete miscarriage).  Medicines, such as: ? Antibiotic medicine, to treat infection. ? Medicine to help the body pass any remaining tissue. ? Medicine to reduce (contract) the size of the uterus. These medicines may be given if you have a lot of bleeding. If you have Rh negative blood and your baby was Rh positive, you will need a shot of a medicine called Rh immunoglobulinto protect your future babies from Rh blood problems. "Rh-negative" and "Rh-positive" refer to whether or not the blood has a specific protein found on the surface of red blood cells (Rh factor). Follow these instructions at home: Medicines   Take over-the-counter and prescription medicines only as told by your health care provider.  If you were prescribed antibiotic medicine, take it as told by your health care provider. Do not stop taking the antibiotic even if you start to feel better.  Do not take NSAIDs, such as aspirin and ibuprofen, unless they are approved by your health care provider. These medicines can cause bleeding. Activity  Rest as directed. Ask your health care provider what activities are safe for you.  Have someone help with home and family responsibilities during this time. General instructions  Keep track of the number of sanitary pads you use each day and how soaked (saturated) they are. Write down this information.  Monitor the  amount of tissue or blood clots that you pass from your vagina. Save any large amounts of tissue for your health care provider to examine.  Do not use tampons, douche, or have sex until your health care provider approves.  To help you and your partner with the process of grieving, talk with your health care provider or seek counseling.   When you are ready, meet with your health care provider to discuss any important steps you should take for your health. Also, discuss steps you should take to have a healthy pregnancy in the future.  Keep all follow-up visits as told by your health care provider. This is important. Where to find more information  The American Congress of Obstetricians and Gynecologists: www.acog.org  [place] Department of Health and Programmer, systems of Women's Health: [place] Contact a health care provider if:  You have a fever or chills.  You have a foul smelling vaginal discharge.  You have more bleeding instead of less. Get help right away if:  You have severe cramps or pain in your back or abdomen.  You pass blood clots or tissue from your vagina that is walnut-sized or larger.  You soak more than 1 regular sanitary pad in an hour.  You become light-headed or weak.  You pass out.  You have feelings of sadness that take over your thoughts, or you have thoughts of hurting yourself. Summary  Most miscarriages happen in the first 3 months of pregnancy. Sometimes miscarriage happens before a woman even knows that she is pregnant.  Follow your health care provider's instruction for home care. Keep all follow-up appointments.  To help you and your partner with the process of grieving, talk with your health care provider or seek counseling. This information is not intended to replace advice given to you by your health care provider. Make sure you discuss any questions you have with your health care provider. Document Released: 03/16/2001 Document Revised: 01/12/2019 Document Reviewed: 10/26/2016 Elsevier Patient Education  2020 [employer].

## 2019-08-29 ENCOUNTER — Ambulatory Visit: Payer: Medicaid Other | Admitting: Obstetrics and Gynecology

## 2019-09-10 ENCOUNTER — Ambulatory Visit: Payer: Medicaid Other | Admitting: Women's Health

## 2020-01-02 ENCOUNTER — Other Ambulatory Visit: Payer: Self-pay

## 2020-01-02 ENCOUNTER — Ambulatory Visit (INDEPENDENT_AMBULATORY_CARE_PROVIDER_SITE_OTHER): Payer: Medicaid Other | Admitting: *Deleted

## 2020-01-02 DIAGNOSIS — N926 Irregular menstruation, unspecified: Secondary | ICD-10-CM | POA: Diagnosis not present

## 2020-01-02 DIAGNOSIS — Z3201 Encounter for pregnancy test, result positive: Secondary | ICD-10-CM

## 2020-01-02 LAB — POCT URINE PREGNANCY: Preg Test, Ur: POSITIVE — AB

## 2020-01-02 NOTE — Progress Notes (Addendum)
   NURSE VISIT- PREGNANCY CONFIRMATION   SUBJECTIVE:  Kaitlyn Fisher is a 24 y.o. [phone] female at [redacted]w[redacted]d by uncertain LMP of Patient's last menstrual period was 11/20/2019 (within days). Here for pregnancy confirmation.  Home pregnancy test: positive x 2  She reports no complaints.  She is not taking prenatal vitamins.    OBJECTIVE:  LMP 11/20/2019 (Within Days)   Breastfeeding No   Appears well, in no apparent distress OB History  Gravida Para Term Preterm AB Living  5 3 3     3   SAB TAB Ectopic Multiple Live Births        0 3    # Outcome Date GA Lbr Len/2nd Weight Sex Delivery Anes PTL Lv  5 Current           4 Term 03/30/16 [redacted]w[redacted]d  7 lb 3.2 oz (3.265 kg) F CS-LTranv Spinal N LIV  3 Term 12/21/12 [redacted]w[redacted]d  7 lb 3 oz (3.26 kg) M CS-LTranv Spinal N LIV     Complications: Cephalopelvic Disproportion  2 Gravida           1 Term     F CS-LTranv   LIV    No results found for this or any previous visit (from the past 24 hour(s)).  ASSESSMENT: Positive pregnancy test, [redacted]w[redacted]d by LMP    PLAN: Schedule for dating ultrasound in 2 weeks Prenatal vitamins: plans to begin OTC ASAP   Nausea medicines: not currently needed   OB packet given: Yes  Shakeria Robinette, [redacted]w[redacted]d  01/02/2020 11:01 AM   Chart reviewed for nurse visit. Agree with plan of care.  01/04/2020, Cheral Marker 01/02/2020 11:16 AM

## 2020-01-15 ENCOUNTER — Other Ambulatory Visit: Payer: Self-pay | Admitting: Obstetrics & Gynecology

## 2020-01-15 DIAGNOSIS — O3680X0 Pregnancy with inconclusive fetal viability, not applicable or unspecified: Secondary | ICD-10-CM

## 2020-01-16 ENCOUNTER — Other Ambulatory Visit: Payer: Self-pay

## 2020-01-16 ENCOUNTER — Other Ambulatory Visit: Payer: Medicaid Other

## 2020-01-16 ENCOUNTER — Ambulatory Visit (INDEPENDENT_AMBULATORY_CARE_PROVIDER_SITE_OTHER): Payer: Medicaid Other

## 2020-01-16 DIAGNOSIS — O3680X0 Pregnancy with inconclusive fetal viability, not applicable or unspecified: Secondary | ICD-10-CM

## 2020-01-16 DIAGNOSIS — Z3A08 8 weeks gestation of pregnancy: Secondary | ICD-10-CM | POA: Diagnosis not present

## 2020-01-16 NOTE — Progress Notes (Signed)
[place] 8+1 wks,single IUP with YS,positive fht 158 bpm,crl 17.30 mm,normal ovaries

## 2020-02-11 ENCOUNTER — Telehealth: Payer: Self-pay | Admitting: Women's Health

## 2020-02-11 ENCOUNTER — Telehealth: Payer: Self-pay | Admitting: *Deleted

## 2020-02-11 NOTE — Telephone Encounter (Signed)
Patient states she is having headaches daily and wants to know what she can do to relieve them. States she has stopped drinking any caffeine and is only drinking water.  Advised to try Tylenol and can reintroduce caffeine daily as this may be a cause of her headaches. Pt verbalized understanding and will try these recommendations.

## 2020-02-11 NOTE — Telephone Encounter (Signed)
Pt states that she is having headaches everyday. No bp cuff at home to check blood pressure.

## 2020-02-12 ENCOUNTER — Other Ambulatory Visit: Payer: Self-pay | Admitting: Obstetrics and Gynecology

## 2020-02-12 DIAGNOSIS — Z3682 Encounter for antenatal screening for nuchal translucency: Secondary | ICD-10-CM

## 2020-02-13 ENCOUNTER — Encounter: Payer: Self-pay | Admitting: Women's Health

## 2020-02-13 ENCOUNTER — Other Ambulatory Visit: Payer: Self-pay

## 2020-02-13 ENCOUNTER — Other Ambulatory Visit (HOSPITAL_COMMUNITY)
Admission: RE | Admit: 2020-02-13 | Discharge: 2020-02-13 | Disposition: A | Discharge status: Home / Self Care | Payer: Medicaid Other | Source: Ambulatory Visit | Attending: Obstetrics and Gynecology | Admitting: Obstetrics and Gynecology

## 2020-02-13 ENCOUNTER — Ambulatory Visit (INDEPENDENT_AMBULATORY_CARE_PROVIDER_SITE_OTHER): Payer: Medicaid Other

## 2020-02-13 ENCOUNTER — Ambulatory Visit (INDEPENDENT_AMBULATORY_CARE_PROVIDER_SITE_OTHER): Payer: Medicaid Other | Admitting: Women's Health

## 2020-02-13 VITALS — BP 116/77 | HR 78 | Wt 191.0 lb

## 2020-02-13 DIAGNOSIS — Z6791 Unspecified blood type, Rh negative: Secondary | ICD-10-CM

## 2020-02-13 DIAGNOSIS — Z3480 Encounter for supervision of other normal pregnancy, unspecified trimester: Secondary | ICD-10-CM

## 2020-02-13 DIAGNOSIS — Z3A12 12 weeks gestation of pregnancy: Secondary | ICD-10-CM

## 2020-02-13 DIAGNOSIS — Z98891 History of uterine scar from previous surgery: Secondary | ICD-10-CM | POA: Diagnosis not present

## 2020-02-13 DIAGNOSIS — O26892 Other specified pregnancy related conditions, second trimester: Secondary | ICD-10-CM | POA: Diagnosis not present

## 2020-02-13 DIAGNOSIS — Z3682 Encounter for antenatal screening for nuchal translucency: Secondary | ICD-10-CM | POA: Diagnosis not present

## 2020-02-13 DIAGNOSIS — Z3481 Encounter for supervision of other normal pregnancy, first trimester: Secondary | ICD-10-CM

## 2020-02-13 LAB — POCT URINALYSIS DIPSTICK OB
Blood, UA: NEGATIVE
Glucose, UA: NEGATIVE
Ketones, UA: NEGATIVE
Leukocytes, UA: NEGATIVE
Nitrite, UA: NEGATIVE
POC,PROTEIN,UA: NEGATIVE

## 2020-02-13 MED ORDER — BLOOD PRESSURE MONITOR MISC: 0 refills | Status: AC

## 2020-02-13 NOTE — Progress Notes (Signed)
INITIAL OBSTETRICAL VISIT Patient name: Kaitlyn Fisher MRN [id number]  Date of birth: 1996-08-05 Chief Complaint:   Initial Prenatal Visit (nt/it)  History of Present Illness:   Kaitlyn Fisher is a 24 y.o. [phone] African American female at [redacted]w[redacted]d by LMP c/w 8wk u/s, with an Estimated Date of Delivery: 08/26/20 being seen today for her initial obstetrical visit.   Her obstetrical history is significant for term c/s x 3, SAB x 1.   Today she reports no complaints.  Depression screen [facility] 2/9 02/13/2020 01/05/2017  Decreased Interest 0 0  Down, Depressed, Hopeless 0 0  PHQ - 2 Score 0 0  Altered sleeping 0 0  Tired, decreased energy 0 3  Change in appetite 0 0  Feeling bad or failure about yourself  0 0  Trouble concentrating 0 3  Moving slowly or fidgety/restless 0 1  Suicidal thoughts 0 0  PHQ-9 Score 0 7  Difficult doing work/chores Not difficult at all -    Patient's last menstrual period was 11/20/2019 (within days). Last pap never. Results were: n/a Review of Systems:   Pertinent items are noted in HPI Denies cramping/contractions, leakage of fluid, vaginal bleeding, abnormal vaginal discharge w/ itching/odor/irritation, headaches, visual changes, shortness of breath, chest pain, abdominal pain, severe nausea/vomiting, or problems with urination or bowel movements unless otherwise stated above.  Pertinent History Reviewed:  Reviewed past medical,surgical, social, obstetrical and family history.  Reviewed problem list, medications and allergies. OB History  Gravida Para Term Preterm AB Living  5 3 3   1 3   SAB TAB Ectopic Multiple Live Births  1     0 3    # Outcome Date GA Lbr Len/2nd Weight Sex Delivery Anes PTL Lv  5 Current           4 SAB 08/27/19 [redacted]w[redacted]d         3 Term 06/28/17 109w0d  6 lb 7 oz (2.92 kg) F CS-LTranv Spinal N LIV  2 Term 03/30/16 [redacted]w[redacted]d  7 lb 3.2 oz (3.265 kg) F CS-LTranv Spinal N LIV  1 Term 12/21/12 [redacted]w[redacted]d  7 lb 3 oz (3.26 kg) M CS-LTranv Spinal N LIV   Complications: Cephalopelvic Disproportion   Physical Assessment:   Vitals:   02/13/20 1157  BP: 116/77  Pulse: 78  Weight: 191 lb (86.6 kg)  Body mass index is 36.09 kg/m.       Physical Examination:  General appearance - well appearing, and in no distress  Mental status - alert, oriented to person, place, and time  Psych:  She has a normal mood and affect  Skin - warm and dry, normal color, no suspicious lesions noted  Chest - effort normal, all lung fields clear to auscultation bilaterally  Heart - normal rate and regular rhythm  Abdomen - soft, nontender  Extremities:  No swelling or varicosities noted  Pelvic - VULVA: normal appearing vulva with no masses, tenderness or lesions  VAGINA: normal appearing vagina with normal color and discharge, no lesions  CERVIX: normal appearing cervix without discharge or lesions, no CMT  Thin prep pap is done w/ HR HPV cotesting  TODAY'S NT  [place] 12+1 wks,measurements c/w dates,crl 58.60 mm,fhr 134 bpm,NB present,NT 1.3 mm,normal ovaries,posterior placenta   Results for orders placed or performed in visit on 02/13/20 (from the past 24 hour(s))  POC Urinalysis Dipstick OB   Collection Time: 02/13/20 12:04 PM  Result Value Ref Range   Color, UA     Clarity, UA  Glucose, UA Negative Negative   Bilirubin, UA     Ketones, UA neg    Spec Grav, UA     Blood, UA neg    pH, UA     POC,PROTEIN,UA Negative Negative, Trace, Small (1+), Moderate (2+), Large (3+), 4+   Urobilinogen, UA     Nitrite, UA neg    Leukocytes, UA Negative Negative   Appearance     Odor      Assessment & Plan:  1) Low-Risk Pregnancy [id number] at [redacted]w[redacted]d with an Estimated Date of Delivery: 08/26/20   2) Initial OB visit  3) Prev c/s x 3  Meds:  Meds ordered this encounter  Medications  . Blood Pressure Monitor MISC    Sig: For regular home bp monitoring during pregnancy    Dispense:  1 each    Refill:  0    Z34.80    Initial labs obtained Continue  prenatal vitamins Reviewed n/v relief measures and warning s/s to report Reviewed recommended weight gain based on pre-gravid BMI Encouraged well-balanced diet Genetic & carrier screening discussed: requests Panorama, NT/IT and Horizon 14  Ultrasound discussed; fetal survey: requested CCNC completed> form faxed if has or is planning to apply for medicaid The nature of [employer] for [employer] with multiple MDs and other Advanced Practice Providers was explained to patient; also emphasized that fellows, residents, and students are part of our team. Does not have home bp cuff. Rx faxed to [facility]. Check bp weekly, let us know if >140/90.   Follow-up: Return in about 3 weeks (around 03/05/2020) for LROB, CNM, in person.   Orders Placed This Encounter  Procedures  . Urine Culture  . Integrated 1  . Genetic Screening  . Pain Management Screening Profile (10S)  . Obstetric Panel, Including HIV  . Hepatitis C antibody  . POC Urinalysis Dipstick OB    Cheral Marker CNM, [facility] 02/13/2020 12:12 PM

## 2020-02-13 NOTE — Patient Instructions (Signed)
Kaitlyn Fisher, I greatly value your feedback.  If you receive a survey following your visit with [place] today, we appreciate you taking the time to fill it out.  Thanks, [name], CNM, WHNP-BC   [facility] ([address] [place], [place] [address]) Entrance C, located off of [address] Free 24/7 valet parking   Nausea & Vomiting  Have saltine crackers or pretzels by your bed and eat a few bites before you raise your head out of bed in the morning  Eat small frequent meals throughout the day instead of large meals  Drink plenty of fluids throughout the day to stay hydrated, just don't drink a lot of fluids with your meals.  This can make your stomach fill up faster making you feel sick  Do not brush your teeth right after you eat  Products with real ginger are good for nausea, like ginger ale and ginger hard candy Make sure it says made with real ginger!  Sucking on sour candy like lemon heads is also good for nausea  If your prenatal vitamins make you nauseated, take them at night so you will sleep through the nausea  Sea Bands  If you feel like you need medicine for the nausea & vomiting please let us know  If you are unable to keep any fluids or food down please let us know   Constipation  Drink plenty of fluid, preferably water, throughout the day  Eat foods high in fiber such as fruits, vegetables, and grains  Exercise, such as walking, is a good way to keep your bowels regular  Drink warm fluids, especially warm prune juice, or decaf coffee  Eat a 1/2 cup of real oatmeal (not instant), 1/2 cup applesauce, and 1/2-1 cup warm prune juice every day  If needed, you may take Colace (docusate sodium) stool softener once or twice a day to help keep the stool soft.   If you still are having problems with constipation, you may take Miralax once daily as needed to help keep your bowels regular.   Home Blood Pressure Monitoring for Patients    Your provider has recommended that you check your blood pressure (BP) at least once a week at home. If you do not have a blood pressure cuff at home, one will be provided for you. Contact your provider if you have not received your monitor within 1 week.   Helpful Tips for Accurate Home Blood Pressure Checks  . Don't smoke, exercise, or drink caffeine 30 minutes before checking your BP . Use the restroom before checking your BP (a full bladder can raise your pressure) . Relax in a comfortable upright chair . Feet on the ground . Left arm resting comfortably on a flat surface at the level of your heart . Legs uncrossed . Back supported . Sit quietly and don't talk . Place the cuff on your bare arm . Adjust snuggly, so that only two fingertips can fit between your skin and the top of the cuff . Check 2 readings separated by at least one minute . Keep a log of your BP readings . For a visual, please reference this diagram: http://ccnc.care/bpdiagram  Provider Name: [facility]     Phone: [phone]  Zone 1: ALL CLEAR  Continue to monitor your symptoms:  . BP reading is less than 140 (top number) or less than 90 (bottom number)  . No right upper stomach pain . No headaches or seeing  spots . No feeling nauseated or throwing up . No swelling in face and hands  Zone 2: CAUTION Call your doctor's office for any of the following:  . BP reading is greater than 140 (top number) or greater than 90 (bottom number)  . Stomach pain under your ribs in the middle or right side . Headaches or seeing spots . Feeling nauseated or throwing up . Swelling in face and hands  Zone 3: EMERGENCY  Seek immediate medical care if you have any of the following:  . BP reading is greater than160 (top number) or greater than 110 (bottom number) . Severe headaches not improving with Tylenol . Serious difficulty catching your breath . Any worsening symptoms from Zone 2    First Trimester of  Pregnancy The first trimester of pregnancy is from week 1 until the end of week 12 (months 1 through 3). A week after a sperm fertilizes an egg, the egg will implant on the wall of the uterus. This embryo will begin to develop into a baby. Genes from you and your partner are forming the baby. The female genes determine whether the baby is a boy or a girl. At 6-8 weeks, the eyes and face are formed, and the heartbeat can be seen on ultrasound. At the end of 12 weeks, all the baby's organs are formed.  Now that you are pregnant, you will want to do everything you can to have a healthy baby. Two of the most important things are to get good prenatal care and to follow your health care provider's instructions. Prenatal care is all the medical care you receive before the baby's birth. This care will help prevent, find, and treat any problems during the pregnancy and childbirth. BODY CHANGES Your body goes through many changes during pregnancy. The changes vary from woman to woman.   You may gain or lose a couple of pounds at first.  You may feel sick to your stomach (nauseous) and throw up (vomit). If the vomiting is uncontrollable, call your health care provider.  You may tire easily.  You may develop headaches that can be relieved by medicines approved by your health care provider.  You may urinate more often. Painful urination may mean you have a bladder infection.  You may develop heartburn as a result of your pregnancy.  You may develop constipation because certain hormones are causing the muscles that push waste through your intestines to slow down.  You may develop hemorrhoids or swollen, bulging veins (varicose veins).  Your breasts may begin to grow larger and become tender. Your nipples may stick out more, and the tissue that surrounds them (areola) may become darker.  Your gums may bleed and may be sensitive to brushing and flossing.  Dark spots or blotches (chloasma, mask of pregnancy)  may develop on your face. This will likely fade after the baby is born.  Your menstrual periods will stop.  You may have a loss of appetite.  You may develop cravings for certain kinds of food.  You may have changes in your emotions from day to day, such as being excited to be pregnant or being concerned that something may go wrong with the pregnancy and baby.  You may have more vivid and strange dreams.  You may have changes in your hair. These can include thickening of your hair, rapid growth, and changes in texture. Some women also have hair loss during or after pregnancy, or hair that feels dry or thin. Your hair  will most likely return to normal after your baby is born. WHAT TO EXPECT AT YOUR PRENATAL VISITS During a routine prenatal visit:  You will be weighed to make sure you and the baby are growing normally.  Your blood pressure will be taken.  Your abdomen will be measured to track your baby's growth.  The fetal heartbeat will be listened to starting around week 10 or 12 of your pregnancy.  Test results from any previous visits will be discussed. Your health care provider may ask you:  How you are feeling.  If you are feeling the baby move.  If you have had any abnormal symptoms, such as leaking fluid, bleeding, severe headaches, or abdominal cramping.  If you have any questions. Other tests that may be performed during your first trimester include:  Blood tests to find your blood type and to check for the presence of any previous infections. They will also be used to check for low iron levels (anemia) and Rh antibodies. Later in the pregnancy, blood tests for diabetes will be done along with other tests if problems develop.  Urine tests to check for infections, diabetes, or protein in the urine.  An ultrasound to confirm the proper growth and development of the baby.  An amniocentesis to check for possible genetic problems.  Fetal screens for spina bifida and  Down syndrome.  You may need other tests to make sure you and the baby are doing well. HOME CARE INSTRUCTIONS  Medicines  Follow your health care provider's instructions regarding medicine use. Specific medicines may be either safe or unsafe to take during pregnancy.  Take your prenatal vitamins as directed.  If you develop constipation, try taking a stool softener if your health care provider approves. Diet  Eat regular, well-balanced meals. Choose a variety of foods, such as meat or vegetable-based protein, fish, milk and low-fat dairy products, vegetables, fruits, and whole grain breads and cereals. Your health care provider will help you determine the amount of weight gain that is right for you.  Avoid raw meat and uncooked cheese. These carry germs that can cause birth defects in the baby.  Eating four or five small meals rather than three large meals a day may help relieve nausea and vomiting. If you start to feel nauseous, eating a few soda crackers can be helpful. Drinking liquids between meals instead of during meals also seems to help nausea and vomiting.  If you develop constipation, eat more high-fiber foods, such as fresh vegetables or fruit and whole grains. Drink enough fluids to keep your urine clear or pale yellow. Activity and Exercise  Exercise only as directed by your health care provider. Exercising will help you:  Control your weight.  Stay in shape.  Be prepared for labor and delivery.  Experiencing pain or cramping in the lower abdomen or low back is a good sign that you should stop exercising. Check with your health care provider before continuing normal exercises.  Try to avoid standing for long periods of time. Move your legs often if you must stand in one place for a long time.  Avoid heavy lifting.  Wear low-heeled shoes, and practice good posture.  You may continue to have sex unless your health care provider directs you otherwise. Relief of Pain  or Discomfort  Wear a good support bra for breast tenderness.    Take warm sitz baths to soothe any pain or discomfort caused by hemorrhoids. Use hemorrhoid cream if your health care provider approves.  Rest with your legs elevated if you have leg cramps or low back pain.  If you develop varicose veins in your legs, wear support hose. Elevate your feet for 15 minutes, 3-4 times a day. Limit salt in your diet. Prenatal Care  Schedule your prenatal visits by the twelfth week of pregnancy. They are usually scheduled monthly at first, then more often in the last 2 months before delivery.  Write down your questions. Take them to your prenatal visits.  Keep all your prenatal visits as directed by your health care provider. Safety  Wear your seat belt at all times when driving.  Make a list of emergency phone numbers, including numbers for family, friends, the hospital, and police and fire departments. General Tips  Ask your health care provider for a referral to a local prenatal education class. Begin classes no later than at the beginning of month 6 of your pregnancy.  Ask for help if you have counseling or nutritional needs during pregnancy. Your health care provider can offer advice or refer you to specialists for help with various needs.  Do not use hot tubs, steam rooms, or saunas.  Do not douche or use tampons or scented sanitary pads.  Do not cross your legs for long periods of time.  Avoid cat litter boxes and soil used by cats. These carry germs that can cause birth defects in the baby and possibly loss of the fetus by miscarriage or stillbirth.  Avoid all smoking, herbs, alcohol, and medicines not prescribed by your health care provider. Chemicals in these affect the formation and growth of the baby.  Schedule a dentist appointment. At home, brush your teeth with a soft toothbrush and be gentle when you floss. SEEK MEDICAL CARE IF:   You have dizziness.  You have mild  pelvic cramps, pelvic pressure, or nagging pain in the abdominal area.  You have persistent nausea, vomiting, or diarrhea.  You have a bad smelling vaginal discharge.  You have pain with urination.  You notice increased swelling in your face, hands, legs, or ankles. SEEK IMMEDIATE MEDICAL CARE IF:   You have a fever.  You are leaking fluid from your vagina.  You have spotting or bleeding from your vagina.  You have severe abdominal cramping or pain.  You have rapid weight gain or loss.  You vomit blood or material that looks like coffee grounds.  You are exposed to [place] measles and have never had them.  You are exposed to fifth disease or chickenpox.  You develop a severe headache.  You have shortness of breath.  You have any kind of trauma, such as from a fall or a car accident. Document Released: 09/14/2001 Document Revised: 02/04/2014 Document Reviewed: 07/31/2013 [facility] Patient Information 2015 [place], [place]. This information is not intended to replace advice given to you by your health care provider. Make sure you discuss any questions you have with your health care provider.

## 2020-02-13 NOTE — Progress Notes (Signed)
[place] 12+1 wks,measurements c/w dates,crl 58.60 mm,fhr 134 bpm,NB present,NT 1.3 mm,normal ovaries,posterior placenta

## 2020-02-14 LAB — CYTOLOGY - PAP
Chlamydia: NEGATIVE
Comment: NEGATIVE
Comment: NORMAL
Diagnosis: NEGATIVE
Neisseria Gonorrhea: NEGATIVE

## 2020-02-15 LAB — OBSTETRIC PANEL, INCLUDING HIV
Antibody Screen: NEGATIVE
Basophils Absolute: 0 10*3/uL (ref 0.0–0.2)
Basos: 0 %
EOS (ABSOLUTE): 0.1 10*3/uL (ref 0.0–0.4)
Eos: 1 %
HIV Screen 4th Generation wRfx: NONREACTIVE
Hematocrit: 37.6 % (ref 34.0–46.6)
Hemoglobin: 12.1 g/dL (ref 11.1–15.9)
Hepatitis B Surface Ag: NEGATIVE
Immature Grans (Abs): 0.1 10*3/uL (ref 0.0–0.1)
Immature Granulocytes: 1 %
Lymphocytes Absolute: 1.5 10*3/uL (ref 0.7–3.1)
Lymphs: 17 %
MCH: 28.7 pg (ref 26.6–33.0)
MCHC: 32.2 g/dL (ref 31.5–35.7)
MCV: 89 fL (ref 79–97)
Monocytes Absolute: 0.5 10*3/uL (ref 0.1–0.9)
Monocytes: 6 %
Neutrophils Absolute: 6.7 10*3/uL (ref 1.4–7.0)
Neutrophils: 75 %
Platelets: 275 10*3/uL (ref 150–450)
RBC: 4.22 x10E6/uL (ref 3.77–5.28)
RDW: 14 % (ref 11.7–15.4)
RPR Ser Ql: NONREACTIVE
Rh Factor: NEGATIVE
Rubella Antibodies, IGG: 2.39 index (ref >0.99)
WBC: 8.9 10*3/uL (ref 3.4–10.8)

## 2020-02-15 LAB — INTEGRATED 1
Crown Rump Length: 58.6 mm
Gest. Age on Collection Date: 12.1 weeks
Maternal Age at EDD: 23.4 yr
Nuchal Translucency (NT): 1.3 mm
Number of Fetuses: 1
PAPP-A Value: 1615.2 ng/mL
Weight: 191 [lb_av]

## 2020-02-15 LAB — PMP SCREEN PROFILE (10S), URINE
Amphetamine Scrn, Ur: NEGATIVE ng/mL
BARBITURATE SCREEN URINE: NEGATIVE ng/mL
BENZODIAZEPINE SCREEN, URINE: NEGATIVE ng/mL
CANNABINOIDS UR QL SCN: NEGATIVE ng/mL
Cocaine (Metab) Scrn, Ur: NEGATIVE ng/mL
Creatinine(Crt), U: 111.4 mg/dL (ref 20.0–300.0)
Methadone Screen, Urine: NEGATIVE ng/mL
OXYCODONE+OXYMORPHONE UR QL SCN: NEGATIVE ng/mL
Opiate Scrn, Ur: NEGATIVE ng/mL
Ph of Urine: 7.5 (ref 4.5–8.9)
Phencyclidine Qn, Ur: NEGATIVE ng/mL
Propoxyphene Scrn, Ur: NEGATIVE ng/mL

## 2020-02-15 LAB — URINE CULTURE

## 2020-02-15 LAB — HEPATITIS C ANTIBODY: Hep C Virus Ab: <0.1 s/co ratio (ref 0.0–0.9)

## 2020-02-18 ENCOUNTER — Telehealth: Payer: Self-pay | Admitting: *Deleted

## 2020-02-18 NOTE — Telephone Encounter (Signed)
Patient informed results are not back at this time but usually take 7-10 days to result.  Pt verbalized understanding and will check on [email or web address] by Saturday.

## 2020-02-18 NOTE — Telephone Encounter (Signed)
Pt calling to check status on genetic results as well as the gender result. Advised pt once results are in they could be in her mychart or the nurse will give pt a call.

## 2020-02-25 ENCOUNTER — Telehealth: Payer: Self-pay | Admitting: Obstetrics and Gynecology

## 2020-02-25 ENCOUNTER — Telehealth: Payer: Self-pay | Admitting: *Deleted

## 2020-02-25 NOTE — Telephone Encounter (Signed)
Patient called stating that she has been experiencing pain in between her legs. Pt states she does not know why. Please contact pt

## 2020-02-25 NOTE — Telephone Encounter (Signed)
Patient states she is having some pain between her thighs that she has not experienced before and is unsure why.  States she did do some stretching this weekend that she does not normally do. Denies bleeding or cramping.  Informed patient that pain is most likely sore muscles since she does not normally stretch daily. Informed it was ok to do but to be mindful of this.  Pt verbalized understanding.

## 2020-02-29 ENCOUNTER — Encounter: Payer: Self-pay | Admitting: Women's Health

## 2020-02-29 DIAGNOSIS — O285 Abnormal chromosomal and genetic finding on antenatal screening of mother: Secondary | ICD-10-CM | POA: Insufficient documentation

## 2020-03-05 ENCOUNTER — Ambulatory Visit (INDEPENDENT_AMBULATORY_CARE_PROVIDER_SITE_OTHER): Payer: Medicaid Other | Admitting: Obstetrics and Gynecology

## 2020-03-05 ENCOUNTER — Encounter: Payer: Self-pay | Admitting: Obstetrics and Gynecology

## 2020-03-05 VITALS — BP 120/76 | HR 87 | Wt 189.2 lb

## 2020-03-05 DIAGNOSIS — Z1389 Encounter for screening for other disorder: Secondary | ICD-10-CM

## 2020-03-05 DIAGNOSIS — Z331 Pregnant state, incidental: Secondary | ICD-10-CM

## 2020-03-05 DIAGNOSIS — Z3A15 15 weeks gestation of pregnancy: Secondary | ICD-10-CM

## 2020-03-05 DIAGNOSIS — Z1379 Encounter for other screening for genetic and chromosomal anomalies: Secondary | ICD-10-CM

## 2020-03-05 DIAGNOSIS — Z3482 Encounter for supervision of other normal pregnancy, second trimester: Secondary | ICD-10-CM

## 2020-03-05 LAB — POCT URINALYSIS DIPSTICK OB
Blood, UA: NEGATIVE
Glucose, UA: NEGATIVE
Ketones, UA: NEGATIVE
Leukocytes, UA: NEGATIVE
Nitrite, UA: NEGATIVE
POC,PROTEIN,UA: NEGATIVE

## 2020-03-05 NOTE — Progress Notes (Signed)
Patient ID: Todd Argabright, female   DOB: 11-09-1995, 24 y.o.   MRN: [id number]   LOW-RISK PREGNANCY VISIT Patient name: Indianna Boran MRN [id number]  Date of birth: [birth year] Chief Complaint:   Routine Prenatal Visit (2nd IT)  History of Present Illness:   Zairah Arista is a 24 y.o. [phone] female at [redacted]w[redacted]d with an Estimated Date of Delivery: 08/26/20 being seen today for ongoing management of a low-risk pregnancy.  Today she reports no complaints. She would like to get her tubes tied after giving birth. This is her fifth pregnancy and she has had one miscarriage.   Contractions: Not present. Vag. Bleeding: None.  Movement: Absent. denies leaking of fluid. Review of Systems:   Pertinent items are noted in HPI Denies abnormal vaginal discharge w/ itching/odor/irritation, headaches, visual changes, shortness of breath, chest pain, abdominal pain, severe nausea/vomiting, or problems with urination or bowel movements unless otherwise stated above. Pertinent History Reviewed:  Reviewed past medical,surgical, social, obstetrical and family history.  Reviewed problem list, medications and allergies. Physical Assessment:   Vitals:   03/05/20 1212  BP: 120/76  Pulse: 87  Weight: 189 lb 3.2 oz (85.8 kg)  Body mass index is 35.75 kg/m.        Physical Examination:   General appearance: Well appearing, and in no distress  Mental status: Alert, oriented to person, place, and time  Skin: Warm & dry  Cardiovascular: Normal heart rate noted  Respiratory: Normal respiratory effort, no distress  Abdomen: Soft, gravid, nontender  Pelvic: Cervical exam deferred         Extremities: Edema: None  Fetal Status:     Movement: Absent    Results for orders placed or performed in visit on 03/05/20 (from the past 24 hour(s))  POC Urinalysis Dipstick OB   Collection Time: 03/05/20 12:13 PM  Result Value Ref Range   Color, UA     Clarity, UA     Glucose, UA Negative Negative   Bilirubin, UA     Ketones,  UA neg    Spec Grav, UA     Blood, UA neg    pH, UA     POC,PROTEIN,UA Negative Negative, Trace, Small (1+), Moderate (2+), Large (3+), 4+   Urobilinogen, UA     Nitrite, UA neg    Leukocytes, UA Negative Negative   Appearance     Odor      Assessment & Plan:  1) Low-risk pregnancy [id number] at [redacted]w[redacted]d with an Estimated Date of Delivery: 08/26/20   2) Hx of c sections x3  3) The patient desires BTL; papers have not been signed yet   Plan:  Continue routine obstetrical care  Meds: No orders of the defined types were placed in this encounter.  Labs/procedures today: Ultrasound, 2nd IT  Follow-up: Return in about 4 weeks (around 04/02/2020) for LROB.  By signing my name below, I, Pietro Cassis, attest that this documentation has been prepared under the direction and in the presence of Tilda Burrow, MD. Electronically Signed: Pietro Cassis, Medical Scribe. 03/05/20. 12:22 PM.  I personally performed the services described in this documentation, which was SCRIBED in my presence. The recorded information has been reviewed and considered accurate. It has been edited as necessary during review. Tilda Burrow, MD

## 2020-03-07 LAB — INTEGRATED 2
AFP MoM: 0.88
Alpha-Fetoprotein: 23.7 ng/mL
Crown Rump Length: 58.6 mm
DIA MoM: 0.72
DIA Value: 104.8 pg/mL
Estriol, Unconjugated: 0.88 ng/mL
Gest. Age on Collection Date: 12.1 weeks
Gestational Age: 15.1 weeks
Maternal Age at EDD: 23.4 yr
Nuchal Translucency (NT): 1.3 mm
Nuchal Translucency MoM: 1
Number of Fetuses: 1
PAPP-A MoM: 2.48
PAPP-A Value: 1615.2 ng/mL
Test Results:: NEGATIVE
Weight: 189 [lb_av]
Weight: 191 [lb_av]
hCG MoM: 1.08
hCG Value: 42.6 IU/mL
uE3 MoM: 1.22

## 2020-03-10 NOTE — Progress Notes (Signed)
Low risk for spina bifida, trisomy 71, trisomy 21 all 1:10,000
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
## 2020-04-02 ENCOUNTER — Ambulatory Visit (INDEPENDENT_AMBULATORY_CARE_PROVIDER_SITE_OTHER): Payer: Medicaid Other | Admitting: Advanced Practice Midwife

## 2020-04-02 ENCOUNTER — Other Ambulatory Visit: Payer: Self-pay

## 2020-04-02 VITALS — BP 115/72 | HR 89 | Wt 190.5 lb

## 2020-04-02 DIAGNOSIS — O360920 Maternal care for other rhesus isoimmunization, second trimester, not applicable or unspecified: Secondary | ICD-10-CM

## 2020-04-02 DIAGNOSIS — Z331 Pregnant state, incidental: Secondary | ICD-10-CM

## 2020-04-02 DIAGNOSIS — Z6791 Unspecified blood type, Rh negative: Secondary | ICD-10-CM

## 2020-04-02 DIAGNOSIS — Z3480 Encounter for supervision of other normal pregnancy, unspecified trimester: Secondary | ICD-10-CM

## 2020-04-02 DIAGNOSIS — O285 Abnormal chromosomal and genetic finding on antenatal screening of mother: Secondary | ICD-10-CM

## 2020-04-02 DIAGNOSIS — Z1389 Encounter for screening for other disorder: Secondary | ICD-10-CM

## 2020-04-02 DIAGNOSIS — Z3A19 19 weeks gestation of pregnancy: Secondary | ICD-10-CM

## 2020-04-02 DIAGNOSIS — Z302 Encounter for sterilization: Secondary | ICD-10-CM | POA: Insufficient documentation

## 2020-04-02 DIAGNOSIS — O34219 Maternal care for unspecified type scar from previous cesarean delivery: Secondary | ICD-10-CM

## 2020-04-02 LAB — POCT URINALYSIS DIPSTICK OB
Blood, UA: NEGATIVE
Glucose, UA: NEGATIVE
Ketones, UA: NEGATIVE
Leukocytes, UA: NEGATIVE
Nitrite, UA: NEGATIVE
POC,PROTEIN,UA: NEGATIVE

## 2020-04-02 NOTE — Progress Notes (Signed)
   LOW-RISK PREGNANCY VISIT Patient name: Kaitlyn Fisher MRN [id number]  Date of birth: December 19, 1995 Chief Complaint:   Routine Prenatal Visit  History of Present Illness:   Kaitlyn Fisher is a 24 y.o. [id number] female at 108w1d with an Estimated Date of Delivery: 08/26/20 being seen today for ongoing management of a low-risk pregnancy.  Today she reports doing well; anatomy u/s not scheduled with this visit. Contractions: Not present. Vag. Bleeding: None.  Movement: Present. denies leaking of fluid. Review of Systems:   Pertinent items are noted in HPI Denies abnormal vaginal discharge w/ itching/odor/irritation, headaches, visual changes, shortness of breath, chest pain, abdominal pain, severe nausea/vomiting, or problems with urination or bowel movements unless otherwise stated above. Pertinent History Reviewed:  Reviewed past medical,surgical, social, obstetrical and family history.  Reviewed problem list, medications and allergies. Physical Assessment:   Vitals:   04/02/20 1024  BP: 115/72  Pulse: 89  Weight: 190 lb 8 oz (86.4 kg)  Body mass index is 35.99 kg/m.        Physical Examination:   General appearance: Well appearing, and in no distress  Mental status: Alert, oriented to person, place, and time  Skin: Warm & dry  Cardiovascular: Normal heart rate noted  Respiratory: Normal respiratory effort, no distress  Abdomen: Soft, gravid, nontender  Pelvic: Cervical exam deferred         Extremities: Edema: None  Fetal Status: Fetal Heart Rate (bpm): 143   Movement: Present    Results for orders placed or performed in visit on 04/02/20 (from the past 24 hour(s))  POC Urinalysis Dipstick OB   Collection Time: 04/02/20 10:28 AM  Result Value Ref Range   Color, UA     Clarity, UA     Glucose, UA Negative Negative   Bilirubin, UA     Ketones, UA neg    Spec Grav, UA     Blood, UA neg    pH, UA     POC,PROTEIN,UA Negative Negative, Trace, Small (1+), Moderate (2+), Large (3+), 4+    Urobilinogen, UA     Nitrite, UA neg    Leukocytes, UA Negative Negative   Appearance     Odor      Assessment & Plan:  1) Low-risk pregnancy [id number] at [redacted]w[redacted]d with an Estimated Date of Delivery: 08/26/20   2) Prev C/S x 3, for repeat with BTL  3) Desires sterilization, will sign 30d papers at 28wks  4) Rh neg, Rhogam at 28wks   Meds: No orders of the defined types were placed in this encounter.  Labs/procedures today: none  Plan:  Continue routine obstetrical care, with 1st avail anatomy u/s  Reviewed: Preterm labor symptoms and general obstetric precautions including but not limited to vaginal bleeding, contractions, leaking of fluid and fetal movement were reviewed in detail with the patient.  All questions were answered. Has home bp cuff. Check bp weekly, let us know if >140/90.   Follow-up: Return for [place]: Anatomy, 1st available; LROB in 4 wks.  Orders Placed This Encounter  Procedures  . US OB Comp + 14 Wk  . POC Urinalysis Dipstick OB   Arabella Merles [facility] 04/02/2020 10:46 AM

## 2020-04-15 ENCOUNTER — Other Ambulatory Visit: Payer: Self-pay

## 2020-04-15 ENCOUNTER — Ambulatory Visit (INDEPENDENT_AMBULATORY_CARE_PROVIDER_SITE_OTHER): Payer: Medicaid Other

## 2020-04-15 DIAGNOSIS — Z3A21 21 weeks gestation of pregnancy: Secondary | ICD-10-CM

## 2020-04-15 DIAGNOSIS — O26899 Other specified pregnancy related conditions, unspecified trimester: Secondary | ICD-10-CM | POA: Diagnosis not present

## 2020-04-15 DIAGNOSIS — Z6791 Unspecified blood type, Rh negative: Secondary | ICD-10-CM

## 2020-04-15 DIAGNOSIS — Z302 Encounter for sterilization: Secondary | ICD-10-CM

## 2020-04-15 DIAGNOSIS — Z3482 Encounter for supervision of other normal pregnancy, second trimester: Secondary | ICD-10-CM

## 2020-04-15 DIAGNOSIS — Z3480 Encounter for supervision of other normal pregnancy, unspecified trimester: Secondary | ICD-10-CM

## 2020-04-15 NOTE — Progress Notes (Signed)
[place] 21 wks,cephalic,posterior fundal placenta gr 0,normal ovaries,cx 3.5 cm,SVP of fluid 5.1 cm,fhr 138 bpm,EFW 442 g 79%,anatomy complete,no obvious abnormalities

## 2020-04-22 ENCOUNTER — Encounter: Payer: Self-pay | Admitting: *Deleted

## 2020-04-30 ENCOUNTER — Encounter: Payer: Medicaid Other | Admitting: Advanced Practice Midwife

## 2020-05-12 ENCOUNTER — Ambulatory Visit (INDEPENDENT_AMBULATORY_CARE_PROVIDER_SITE_OTHER): Payer: Medicaid Other | Admitting: Obstetrics & Gynecology

## 2020-05-12 ENCOUNTER — Encounter: Payer: Self-pay | Admitting: Obstetrics & Gynecology

## 2020-05-12 VITALS — BP 110/72 | HR 88 | Wt 189.0 lb

## 2020-05-12 DIAGNOSIS — Z3A24 24 weeks gestation of pregnancy: Secondary | ICD-10-CM

## 2020-05-12 DIAGNOSIS — Z331 Pregnant state, incidental: Secondary | ICD-10-CM

## 2020-05-12 DIAGNOSIS — Z3480 Encounter for supervision of other normal pregnancy, unspecified trimester: Secondary | ICD-10-CM

## 2020-05-12 DIAGNOSIS — Z1389 Encounter for screening for other disorder: Secondary | ICD-10-CM

## 2020-05-12 NOTE — Progress Notes (Signed)
   LOW-RISK PREGNANCY VISIT Patient name: Kaitlyn Fisher MRN [id number]  Date of birth: 04/23/1996 Chief Complaint:   Routine Prenatal Visit  History of Present Illness:   Kaitlyn Fisher is a 24 y.o. [id number] female at [redacted]w[redacted]d with an Estimated Date of Delivery: 08/26/20 being seen today for ongoing management of a low-risk pregnancy.  Depression screen [facility] 2/9 02/13/2020 01/05/2017  Decreased Interest 0 0  Down, Depressed, Hopeless 0 0  PHQ - 2 Score 0 0  Altered sleeping 0 0  Tired, decreased energy 0 3  Change in appetite 0 0  Feeling bad or failure about yourself  0 0  Trouble concentrating 0 3  Moving slowly or fidgety/restless 0 1  Suicidal thoughts 0 0  PHQ-9 Score 0 7  Difficult doing work/chores Not difficult at all -    Today she reports no complaints. Contractions: Not present. Vag. Bleeding: None.  Movement: Present. denies leaking of fluid. Review of Systems:   Pertinent items are noted in HPI Denies abnormal vaginal discharge w/ itching/odor/irritation, headaches, visual changes, shortness of breath, chest pain, abdominal pain, severe nausea/vomiting, or problems with urination or bowel movements unless otherwise stated above. Pertinent History Reviewed:  Reviewed past medical,surgical, social, obstetrical and family history.  Reviewed problem list, medications and allergies. Physical Assessment:   Vitals:   05/12/20 1523  BP: 110/72  Pulse: 88  Weight: 189 lb (85.7 kg)  Body mass index is 35.71 kg/m.        Physical Examination:   General appearance: Well appearing, and in no distress  Mental status: Alert, oriented to person, place, and time  Skin: Warm & dry  Cardiovascular: Normal heart rate noted  Respiratory: Normal respiratory effort, no distress  Abdomen: Soft, gravid, nontender  Pelvic: Cervical exam deferred         Extremities: Edema: None  Fetal Status: Fetal Heart Rate (bpm): 140 Fundal Height: 25 cm Movement: Present    Chaperone: n/a    No  results found for this or any previous visit (from the past 24 hour(s)).  Assessment & Plan:  1) Low-risk pregnancy [id number] at [redacted]w[redacted]d with an Estimated Date of Delivery: 08/26/20   2) Previous C section x 3 for repat, wants BTL,    Meds: No orders of the defined types were placed in this encounter.  Labs/procedures today:   Plan:  Continue routine obstetrical care  Next visit: prefers in person    Reviewed: Preterm labor symptoms and general obstetric precautions including but not limited to vaginal bleeding, contractions, leaking of fluid and fetal movement were reviewed in detail with the patient.  All questions were answered. Has home bp cuff. Rx faxed to . Check bp weekly, let us know if >140/90.   Follow-up: Return in about 3 weeks (around 06/02/2020) for LROB, sign BTL papers at PN2 viit, PN2.  Orders Placed This Encounter  Procedures  . POC Urinalysis Dipstick OB   Lazaro Arms  05/12/2020 3:53 PM

## 2020-06-02 ENCOUNTER — Encounter: Payer: Medicaid Other | Admitting: Obstetrics and Gynecology

## 2020-06-02 ENCOUNTER — Other Ambulatory Visit: Payer: Medicaid Other

## 2020-06-13 ENCOUNTER — Other Ambulatory Visit: Payer: Medicaid Other

## 2020-06-13 ENCOUNTER — Ambulatory Visit (INDEPENDENT_AMBULATORY_CARE_PROVIDER_SITE_OTHER): Payer: Medicaid Other | Admitting: Obstetrics and Gynecology

## 2020-06-13 VITALS — BP 107/59 | HR 84 | Wt 186.0 lb

## 2020-06-13 DIAGNOSIS — Z1389 Encounter for screening for other disorder: Secondary | ICD-10-CM | POA: Diagnosis not present

## 2020-06-13 DIAGNOSIS — Z3A29 29 weeks gestation of pregnancy: Secondary | ICD-10-CM

## 2020-06-13 DIAGNOSIS — Z331 Pregnant state, incidental: Secondary | ICD-10-CM

## 2020-06-13 DIAGNOSIS — Z23 Encounter for immunization: Secondary | ICD-10-CM

## 2020-06-13 DIAGNOSIS — Z3480 Encounter for supervision of other normal pregnancy, unspecified trimester: Secondary | ICD-10-CM

## 2020-06-13 LAB — POCT URINALYSIS DIPSTICK OB
Blood, UA: NEGATIVE
Glucose, UA: NEGATIVE
Ketones, UA: NEGATIVE
Leukocytes, UA: NEGATIVE
Nitrite, UA: NEGATIVE
POC,PROTEIN,UA: NEGATIVE

## 2020-06-13 NOTE — Progress Notes (Signed)
LOW-RISK PREGNANCY VISIT Patient name: Kaitlyn Fisher MRN [id number]  Date of birth: August 01, 1996 Chief Complaint:   Routine Prenatal Visit  History of Present Illness:   Naseem Adler is a 24 y.o. [phone] female at [redacted]w[redacted]d prior cesarean x2 for repeat and BTL 39 wk with an Estimated Date of Delivery: 08/26/20 being seen today for ongoing management of a low-risk pregnancy.  Depression screen [facility] 2/9 06/13/2020 06/13/2020 02/13/2020 01/05/2017  Decreased Interest 0 0 0 0  Down, Depressed, Hopeless 0 0 0 0  PHQ - 2 Score 0 0 0 0  Altered sleeping 0 - 0 0  Tired, decreased energy 0 - 0 3  Change in appetite 0 - 0 0  Feeling bad or failure about yourself  0 - 0 0  Trouble concentrating 0 - 0 3  Moving slowly or fidgety/restless 0 - 0 1  Suicidal thoughts 0 - 0 0  PHQ-9 Score 0 - 0 7  Difficult doing work/chores - - Not difficult at all -    Today she reports no complaints. Contractions: Not present. Vag. Bleeding: None.  Movement: Present. denies leaking of fluid. Review of Systems:   Pertinent items are noted in HPI Denies abnormal vaginal discharge w/ itching/odor/irritation, headaches, visual changes, shortness of breath, chest pain, abdominal pain, severe nausea/vomiting, or problems with urination or bowel movements unless otherwise stated above. Pertinent History Reviewed:  Reviewed past medical,surgical, social, obstetrical and family history.  Reviewed problem list, medications and allergies. Physical Assessment:   Vitals:   06/13/20 0951  BP: (!) 107/59  Pulse: 84  Weight: 186 lb (84.4 kg)  Body mass index is 35.14 kg/m.        Physical Examination:   General appearance: Well appearing, and in no distress  Mental status: Alert, oriented to person, place, and time  Skin: Warm & dry  Cardiovascular: Normal heart rate noted  Respiratory: Normal respiratory effort, no distress  Abdomen: Soft, gravid, non-tender. Stable umbilical hernia.  Pelvic: Cervical exam deferred          Extremities: Edema: None  Fetal Status: Fetal Heart Rate (bpm): 143 Fundal Height: 27 cm Movement: Present    Chaperone: Nikki Dom    Results for orders placed or performed in visit on 06/13/20 (from the past 24 hour(s))  POC Urinalysis Dipstick OB   Collection Time: 06/13/20  9:55 AM  Result Value Ref Range   Color, UA     Clarity, UA     Glucose, UA Negative Negative   Bilirubin, UA     Ketones, UA neg    Spec Grav, UA     Blood, UA neg    pH, UA     POC,PROTEIN,UA Negative Negative, Trace, Small (1+), Moderate (2+), Large (3+), 4+   Urobilinogen, UA     Nitrite, UA neg    Leukocytes, UA Negative Negative   Appearance     Odor      Assessment & Plan:  1) Low-risk pregnancy [id number] at [redacted]w[redacted]d with an Estimated Date of Delivery: 08/26/20   2) Prior cesarean x2, desires repeat and BTL. Consent signed.    Meds: No orders of the defined types were placed in this encounter.  Labs/procedures today: Glucose test, TDAP given  Plan: Continue routine obstetrical care.   Next visit: prefers in person    Reviewed: Preterm labor symptoms and general obstetric precautions including but not limited to vaginal bleeding, contractions, leaking of fluid and fetal movement were reviewed in detail with the patient.  All questions were answered. Has home bp cuff. Check bp weekly, let us know if >140/90.   Follow-up: Return in about 4 weeks (around 07/11/2020) for LROB.  Orders Placed This Encounter  Procedures  . Tdap vaccine greater than or equal to 7yo IM  . POC Urinalysis Dipstick OB   By signing my name below, I, Nikki Dom, attest that this documentation has been prepared under the direction and in the presence of Tilda Burrow, MD. Electronically Signed: Nikki Dom Medical Scribe. 06/13/20. 10:13 AM.  I personally performed the services described in this documentation, which was SCRIBED in my presence. The recorded information has been reviewed and considered accurate. It  has been edited as necessary during review. Tilda Burrow, MD     Addendum: The patient became lightheaded upon checking out. She was laid down and assessed. She was monitored for 15 minutes and reported feeling much better. BP was within normal limits. Blood sugar was 166.

## 2020-06-14 LAB — RPR: RPR Ser Ql: NONREACTIVE

## 2020-06-14 LAB — GLUCOSE TOLERANCE, 2 HOURS W/ 1HR
Glucose, 1 hour: 119 mg/dL (ref 65–179)
Glucose, 2 hour: 94 mg/dL (ref 65–152)
Glucose, Fasting: 79 mg/dL (ref 65–91)

## 2020-06-14 LAB — ANTIBODY SCREEN: Antibody Screen: NEGATIVE

## 2020-06-14 LAB — CBC
Hematocrit: 33.1 % — ABNORMAL LOW (ref 34.0–46.6)
Hemoglobin: 10.6 g/dL — ABNORMAL LOW (ref 11.1–15.9)
MCH: 28.6 pg (ref 26.6–33.0)
MCHC: 32 g/dL (ref 31.5–35.7)
MCV: 89 fL (ref 79–97)
Platelets: 260 10*3/uL (ref 150–450)
RBC: 3.71 x10E6/uL — ABNORMAL LOW (ref 3.77–5.28)
RDW: 12.7 % (ref 11.7–15.4)
WBC: 9.3 10*3/uL (ref 3.4–10.8)

## 2020-06-14 LAB — HIV ANTIBODY (ROUTINE TESTING W REFLEX): HIV Screen 4th Generation wRfx: NONREACTIVE

## 2020-06-16 ENCOUNTER — Other Ambulatory Visit: Payer: Self-pay | Admitting: Women's Health

## 2020-06-16 MED ORDER — FERROUS SULFATE 325 (65 FE) MG PO TABS: 325.0000 mg | ORAL_TABLET | Freq: Two times a day (BID) | ORAL | 3 refills | Status: DC

## 2020-07-11 ENCOUNTER — Encounter: Payer: Self-pay | Admitting: Obstetrics & Gynecology

## 2020-07-11 ENCOUNTER — Ambulatory Visit (INDEPENDENT_AMBULATORY_CARE_PROVIDER_SITE_OTHER): Payer: Medicaid Other | Admitting: Obstetrics & Gynecology

## 2020-07-11 VITALS — BP 112/72 | HR 90 | Wt 194.5 lb

## 2020-07-11 DIAGNOSIS — Z3483 Encounter for supervision of other normal pregnancy, third trimester: Secondary | ICD-10-CM | POA: Diagnosis not present

## 2020-07-11 DIAGNOSIS — Z331 Pregnant state, incidental: Secondary | ICD-10-CM

## 2020-07-11 DIAGNOSIS — Z6791 Unspecified blood type, Rh negative: Secondary | ICD-10-CM | POA: Diagnosis not present

## 2020-07-11 DIAGNOSIS — Z1389 Encounter for screening for other disorder: Secondary | ICD-10-CM

## 2020-07-11 DIAGNOSIS — O26893 Other specified pregnancy related conditions, third trimester: Secondary | ICD-10-CM

## 2020-07-11 DIAGNOSIS — Z3A33 33 weeks gestation of pregnancy: Secondary | ICD-10-CM

## 2020-07-11 LAB — POCT URINALYSIS DIPSTICK OB
Blood, UA: NEGATIVE
Glucose, UA: NEGATIVE
Ketones, UA: NEGATIVE
Nitrite, UA: NEGATIVE
POC,PROTEIN,UA: NEGATIVE

## 2020-07-11 NOTE — Progress Notes (Signed)
LOW-RISK PREGNANCY VISIT Patient name: Kaitlyn Fisher MRN [id number]  Date of birth: 01/01/1996 Chief Complaint:   Routine Prenatal Visit (Rhogam today)  History of Present Illness:   Kaitlyn Fisher is a 24 y.o. [phone] female at [redacted]w[redacted]d with an Estimated Date of Delivery: 08/26/20 being seen today for ongoing management of a low-risk pregnancy.  Depression screen [facility] 2/9 06/13/2020 06/13/2020 02/13/2020 01/05/2017  Decreased Interest 0 0 0 0  Down, Depressed, Hopeless 0 0 0 0  PHQ - 2 Score 0 0 0 0  Altered sleeping 0 - 0 0  Tired, decreased energy 0 - 0 3  Change in appetite 0 - 0 0  Feeling bad or failure about yourself  0 - 0 0  Trouble concentrating 0 - 0 3  Moving slowly or fidgety/restless 0 - 0 1  Suicidal thoughts 0 - 0 0  PHQ-9 Score 0 - 0 7  Difficult doing work/chores - - Not difficult at all -    Today she reports no complaints. Contractions: Not present. Vag. Bleeding: None.  Movement: Present. denies leaking of fluid. Review of Systems:   Pertinent items are noted in HPI Denies abnormal vaginal discharge w/ itching/odor/irritation, headaches, visual changes, shortness of breath, chest pain, abdominal pain, severe nausea/vomiting, or problems with urination or bowel movements unless otherwise stated above. Pertinent History Reviewed:  Reviewed past medical,surgical, social, obstetrical and family history.  Reviewed problem list, medications and allergies. Physical Assessment:   Vitals:   07/11/20 1126  BP: 112/72  Pulse: 90  Weight: 194 lb 8 oz (88.2 kg)  Body mass index is 36.75 kg/m.        Physical Examination:   General appearance: Well appearing, and in no distress  Mental status: Alert, oriented to person, place, and time  Skin: Warm & dry  Cardiovascular: Normal heart rate noted  Respiratory: Normal respiratory effort, no distress  Abdomen: Soft, gravid, nontender  Pelvic: Cervical exam deferred         Extremities: Edema: None  Fetal Status: Fetal Heart  Rate (bpm): 147 Fundal Height: 33 cm Movement: Present    Chaperone: n/a    Results for orders placed or performed in visit on 07/11/20 (from the past 24 hour(s))  POC Urinalysis Dipstick OB   Collection Time: 07/11/20 11:27 AM  Result Value Ref Range   Color, UA     Clarity, UA     Glucose, UA Negative Negative   Bilirubin, UA     Ketones, UA neg    Spec Grav, UA     Blood, UA neg    pH, UA     POC,PROTEIN,UA Negative Negative, Trace, Small (1+), Moderate (2+), Large (3+), 4+   Urobilinogen, UA     Nitrite, UA neg    Leukocytes, UA Moderate (2+) (A) Negative   Appearance     Odor      Assessment & Plan:  1) Low-risk pregnancy [id number] at [redacted]w[redacted]d with an Estimated Date of Delivery: 08/26/20   2) Signed BTL papers,    Meds: No orders of the defined types were placed in this encounter.  Labs/procedures today:   Plan:  Continue routine obstetrical care  Next visit: prefers in person    Reviewed: Preterm labor symptoms and general obstetric precautions including but not limited to vaginal bleeding, contractions, leaking of fluid and fetal movement were reviewed in detail with the patient.  All questions were answered. Has home bp cuff. Rx faxed to . Check bp weekly, let us  know if >140/90.   Follow-up: Return in about 3 weeks (around 08/01/2020) for LROB.  Orders Placed This Encounter  Procedures  . RHO (D) Immune Globulin  . POC Urinalysis Dipstick OB   Amaryllis Dyke Jaquaya Coyle  07/11/2020 11:43 AM

## 2020-08-01 ENCOUNTER — Ambulatory Visit (INDEPENDENT_AMBULATORY_CARE_PROVIDER_SITE_OTHER): Payer: Medicaid Other | Admitting: Obstetrics & Gynecology

## 2020-08-01 ENCOUNTER — Other Ambulatory Visit (HOSPITAL_COMMUNITY)
Admission: RE | Admit: 2020-08-01 | Discharge: 2020-08-01 | Disposition: A | Discharge status: Home / Self Care | Payer: Medicaid Other | Source: Ambulatory Visit | Attending: Obstetrics & Gynecology | Admitting: Obstetrics & Gynecology

## 2020-08-01 ENCOUNTER — Encounter: Payer: Self-pay | Admitting: Obstetrics & Gynecology

## 2020-08-01 VITALS — BP 126/82 | HR 96 | Wt 197.0 lb

## 2020-08-01 DIAGNOSIS — Z3A36 36 weeks gestation of pregnancy: Secondary | ICD-10-CM | POA: Diagnosis present

## 2020-08-01 DIAGNOSIS — Z1389 Encounter for screening for other disorder: Secondary | ICD-10-CM

## 2020-08-01 DIAGNOSIS — Z331 Pregnant state, incidental: Secondary | ICD-10-CM

## 2020-08-01 DIAGNOSIS — Z3480 Encounter for supervision of other normal pregnancy, unspecified trimester: Secondary | ICD-10-CM | POA: Diagnosis present

## 2020-08-01 DIAGNOSIS — Z98891 History of uterine scar from previous surgery: Secondary | ICD-10-CM

## 2020-08-01 LAB — POCT URINALYSIS DIPSTICK OB
Blood, UA: NEGATIVE
Glucose, UA: NEGATIVE
Ketones, UA: NEGATIVE
Leukocytes, UA: NEGATIVE
Nitrite, UA: NEGATIVE

## 2020-08-01 NOTE — Progress Notes (Signed)
LOW-RISK PREGNANCY VISIT Patient name: Kaitlyn Fisher MRN [id number]  Date of birth: [birth year] Chief Complaint:   Routine Prenatal Visit (GBS, GC/CHL)  History of Present Illness:   Kaitlyn Fisher is a 24 y.o. [phone] female at [redacted]w[redacted]d with an Estimated Date of Delivery: 08/26/20 being seen today for ongoing management of a low-risk pregnancy.  Depression screen [facility] 2/9 06/13/2020 06/13/2020 02/13/2020 01/05/2017  Decreased Interest 0 0 0 0  Down, Depressed, Hopeless 0 0 0 0  PHQ - 2 Score 0 0 0 0  Altered sleeping 0 - 0 0  Tired, decreased energy 0 - 0 3  Change in appetite 0 - 0 0  Feeling bad or failure about yourself  0 - 0 0  Trouble concentrating 0 - 0 3  Moving slowly or fidgety/restless 0 - 0 1  Suicidal thoughts 0 - 0 0  PHQ-9 Score 0 - 0 7  Difficult doing work/chores - - Not difficult at all -    Today she reports no complaints. Contractions: Not present. Vag. Bleeding: None.  Movement: Present. denies leaking of fluid. Review of Systems:   Pertinent items are noted in HPI Denies abnormal vaginal discharge w/ itching/odor/irritation, headaches, visual changes, shortness of breath, chest pain, abdominal pain, severe nausea/vomiting, or problems with urination or bowel movements unless otherwise stated above. Pertinent History Reviewed:  Reviewed past medical,surgical, social, obstetrical and family history.  Reviewed problem list, medications and allergies. Physical Assessment:   Vitals:   08/01/20 0929  BP: 126/82  Pulse: 96  Weight: 197 lb (89.4 kg)  Body mass index is 37.22 kg/m.        Physical Examination:   General appearance: Well appearing, and in no distress  Mental status: Alert, oriented to person, place, and time  Skin: Warm & dry  Cardiovascular: Normal heart rate noted  Respiratory: Normal respiratory effort, no distress  Abdomen: Soft, gravid, nontender  Pelvic: Cervical exam performed        LTC  Extremities: Edema: None  Fetal Status:     Movement:  Present    Chaperone: Kaitlyn Fisher   Results for orders placed or performed in visit on 08/01/20 (from the past 24 hour(s))  POC Urinalysis Dipstick OB   Collection Time: 08/01/20  9:30 AM  Result Value Ref Range   Color, UA     Clarity, UA     Glucose, UA Negative Negative   Bilirubin, UA     Ketones, UA neg    Spec Grav, UA     Blood, UA neg    pH, UA     POC,PROTEIN,UA Trace Negative, Trace, Small (1+), Moderate (2+), Large (3+), 4+   Urobilinogen, UA     Nitrite, UA neg    Leukocytes, UA Negative Negative   Appearance     Odor      Assessment & Plan:  1) Low-risk pregnancy [id number] at [redacted]w[redacted]d with an Estimated Date of Delivery: 08/26/20   2) Previous C section x 3, desires sterilization, 08/24/20 0730   Meds: No orders of the defined types were placed in this encounter.  Labs/procedures today: vaginal cultures  Plan:  Continue routine obstetrical care  Next visit: prefers in person    Reviewed: Term labor symptoms and general obstetric precautions including but not limited to vaginal bleeding, contractions, leaking of fluid and fetal movement were reviewed in detail with the patient.  All questions were answered.  home bp cuff. Rx faxed to . Check bp weekly, let us know  if >140/90.   Follow-up: Return in about 2 weeks (around 08/15/2020) for LROB.  Orders Placed This Encounter  Procedures  . Strep Gp B NAA  . POC Urinalysis Dipstick OB   Lazaro Arms  08/01/2020 9:59 AM

## 2020-08-03 LAB — STREP GP B NAA: Strep Gp B NAA: NEGATIVE

## 2020-08-04 LAB — CERVICOVAGINAL ANCILLARY ONLY
Chlamydia: NEGATIVE
Comment: NEGATIVE
Comment: NORMAL
Neisseria Gonorrhea: NEGATIVE

## 2020-08-11 ENCOUNTER — Telehealth (HOSPITAL_COMMUNITY): Payer: Self-pay | Admitting: *Deleted

## 2020-08-11 NOTE — Telephone Encounter (Signed)
Preadmission screen  

## 2020-08-11 NOTE — Patient Instructions (Signed)
Kris Burd  08/11/2020   Your procedure is scheduled on:  08/24/2020  Arrive at 0530 at [employer] [facility] on [address] at [facility]  and [employer]. You are invited to use the FREE valet parking or use the Visitor's parking deck.  Pick up the phone at the desk and dial [phone].  Call this number if you have problems the morning of surgery: [phone]  Remember:   Do not eat food:(After Midnight) Desps de medianoche.  Do not drink clear liquids: (After Midnight) Desps de medianoche.  Take these medicines the morning of surgery with A SIP OF WATER:  none   Do not wear jewelry, make-up or nail polish.  Do not wear lotions, powders, or perfumes. Do not wear deodorant.  Do not shave 48 hours prior to surgery.  Do not bring valuables to the hospital.  [facility] is not   responsible for any belongings or valuables brought to the hospital.  Contacts, dentures or bridgework may not be worn into surgery.  Leave suitcase in the car. After surgery it may be brought to your room.  For patients admitted to the hospital, checkout time is 11:00 AM the day of              discharge.      Please read over the following fact sheets that you were given:     Preparing for Surgery

## 2020-08-13 ENCOUNTER — Telehealth (HOSPITAL_COMMUNITY): Payer: Self-pay | Admitting: *Deleted

## 2020-08-13 NOTE — Telephone Encounter (Signed)
Preadmission screen  

## 2020-08-14 ENCOUNTER — Encounter (HOSPITAL_COMMUNITY): Payer: Self-pay

## 2020-08-15 ENCOUNTER — Encounter: Payer: Medicaid Other | Admitting: Advanced Practice Midwife

## 2020-08-22 ENCOUNTER — Other Ambulatory Visit: Payer: Self-pay

## 2020-08-22 ENCOUNTER — Encounter (HOSPITAL_COMMUNITY)
Admission: RE | Admit: 2020-08-22 | Discharge: 2020-08-22 | Disposition: A | Discharge status: Home / Self Care | Payer: Medicaid Other | Source: Ambulatory Visit | Attending: Obstetrics & Gynecology | Admitting: Obstetrics & Gynecology

## 2020-08-22 ENCOUNTER — Other Ambulatory Visit (HOSPITAL_COMMUNITY)
Admission: RE | Admit: 2020-08-22 | Discharge: 2020-08-22 | Disposition: A | Discharge status: Home / Self Care | Payer: Medicaid Other | Source: Ambulatory Visit | Attending: Obstetrics & Gynecology | Admitting: Obstetrics & Gynecology

## 2020-08-22 DIAGNOSIS — Z01812 Encounter for preprocedural laboratory examination: Secondary | ICD-10-CM | POA: Insufficient documentation

## 2020-08-22 DIAGNOSIS — Z20822 Contact with and (suspected) exposure to covid-19: Secondary | ICD-10-CM | POA: Diagnosis not present

## 2020-08-22 LAB — CBC
HCT: 34.8 % — ABNORMAL LOW (ref 36.0–46.0)
Hemoglobin: 10.7 g/dL — ABNORMAL LOW (ref 12.0–15.0)
MCH: 27.9 pg (ref 26.0–34.0)
MCHC: 30.7 g/dL (ref 30.0–36.0)
MCV: 90.6 fL (ref 80.0–100.0)
Platelets: 220 10*3/uL (ref 150–400)
RBC: 3.84 MIL/uL — ABNORMAL LOW (ref 3.87–5.11)
RDW: 16.4 % — ABNORMAL HIGH (ref 11.5–15.5)
WBC: 8.4 10*3/uL (ref 4.0–10.5)
nRBC: 0 % (ref 0.0–0.2)

## 2020-08-22 LAB — COMPREHENSIVE METABOLIC PANEL
ALT: 8 U/L (ref 0–44)
AST: 15 U/L (ref 15–41)
Albumin: 2.7 g/dL — ABNORMAL LOW (ref 3.5–5.0)
Alkaline Phosphatase: 118 U/L (ref 38–126)
Anion gap: 8 (ref 5–15)
BUN: <5 mg/dL — ABNORMAL LOW (ref 6–20)
CO2: 20 mmol/L — ABNORMAL LOW (ref 22–32)
Calcium: 8.9 mg/dL (ref 8.9–10.3)
Chloride: 107 mmol/L (ref 98–111)
Creatinine, Ser: 0.55 mg/dL (ref 0.44–1.00)
GFR, Estimated: >60 mL/min (ref >60)
Glucose, Bld: 78 mg/dL (ref 70–99)
Potassium: 3.7 mmol/L (ref 3.5–5.1)
Sodium: 135 mmol/L (ref 135–145)
Total Bilirubin: 0.4 mg/dL (ref 0.3–1.2)
Total Protein: 6.2 g/dL — ABNORMAL LOW (ref 6.5–8.1)

## 2020-08-22 LAB — TYPE AND SCREEN
ABO/RH(D): A NEG
Antibody Screen: NEGATIVE

## 2020-08-22 LAB — RAPID HIV SCREEN (HIV 1/2 AB+AG)
HIV 1/2 Antibodies: NONREACTIVE
HIV-1 P24 Antigen - HIV24: NONREACTIVE

## 2020-08-22 LAB — SARS CORONAVIRUS 2 (TAT 6-24 HRS): SARS Coronavirus 2: NEGATIVE

## 2020-08-23 LAB — RPR: RPR Ser Ql: NONREACTIVE

## 2020-08-23 NOTE — Anesthesia Preprocedure Evaluation (Signed)
Anesthesia Evaluation  Patient identified by MRN, date of birth, ID band Patient awake    Reviewed: Allergy & Precautions, H&P , NPO status , Patient's Chart, lab work & pertinent test results  Airway Mallampati: I  TM Distance: >3 FB Neck ROM: full    Dental no notable dental hx. (+) Teeth Intact, Dental Advisory Given   Pulmonary neg pulmonary ROS,    Pulmonary exam normal        Cardiovascular negative cardio ROS Normal cardiovascular exam     Neuro/Psych PSYCHIATRIC DISORDERS negative neurological ROS     GI/Hepatic negative GI ROS, Neg liver ROS,   Endo/Other  negative endocrine ROS  Renal/GU negative Renal ROS     Musculoskeletal   Abdominal (+) + obese,   Peds  Hematology negative hematology ROS (+) Blood dyscrasia, anemia ,   Anesthesia Other Findings   Reproductive/Obstetrics (+) Pregnancy                             Anesthesia Physical  Anesthesia Plan  ASA: II  Anesthesia Plan: Spinal   Post-op Pain Management:    Induction:   PONV Risk Score and Plan: 2 and Ondansetron, Dexamethasone, Treatment may vary due to age or medical condition and Scopolamine patch - Pre-op  Airway Management Planned: Natural Airway  Additional Equipment: None  Intra-op Plan:   Post-operative Plan:   Informed Consent: I have reviewed the patients History and Physical, chart, labs and discussed the procedure including the risks, benefits and alternatives for the proposed anesthesia with the patient or authorized representative who has indicated his/her understanding and acceptance.       Plan Discussed with: CRNA and Surgeon  Anesthesia Plan Comments:         Anesthesia Quick Evaluation

## 2020-08-24 ENCOUNTER — Other Ambulatory Visit: Payer: Self-pay

## 2020-08-24 ENCOUNTER — Inpatient Hospital Stay (HOSPITAL_COMMUNITY): Payer: Medicaid Other | Admitting: Anesthesiology

## 2020-08-24 ENCOUNTER — Inpatient Hospital Stay (HOSPITAL_COMMUNITY)
Admission: RE | Admit: 2020-08-24 | Discharge: 2020-08-26 | DRG: 785 | Disposition: A | Discharge status: Home / Self Care | Payer: Medicaid Other | Attending: Obstetrics and Gynecology | Admitting: Obstetrics and Gynecology

## 2020-08-24 ENCOUNTER — Encounter (HOSPITAL_COMMUNITY): Payer: Self-pay | Admitting: Obstetrics & Gynecology

## 2020-08-24 ENCOUNTER — Encounter (HOSPITAL_COMMUNITY)
Admission: RE | Disposition: A | Discharge status: Home / Self Care | Payer: Self-pay | Source: Home / Self Care | Attending: Obstetrics and Gynecology

## 2020-08-24 DIAGNOSIS — D569 Thalassemia, unspecified: Secondary | ICD-10-CM | POA: Diagnosis present

## 2020-08-24 DIAGNOSIS — Z302 Encounter for sterilization: Secondary | ICD-10-CM | POA: Diagnosis not present

## 2020-08-24 DIAGNOSIS — O285 Abnormal chromosomal and genetic finding on antenatal screening of mother: Secondary | ICD-10-CM | POA: Diagnosis present

## 2020-08-24 DIAGNOSIS — Z98891 History of uterine scar from previous surgery: Secondary | ICD-10-CM

## 2020-08-24 DIAGNOSIS — O34211 Maternal care for low transverse scar from previous cesarean delivery: Principal | ICD-10-CM | POA: Diagnosis present

## 2020-08-24 DIAGNOSIS — O26893 Other specified pregnancy related conditions, third trimester: Secondary | ICD-10-CM | POA: Diagnosis present

## 2020-08-24 DIAGNOSIS — Z3A39 39 weeks gestation of pregnancy: Secondary | ICD-10-CM

## 2020-08-24 DIAGNOSIS — Z9851 Tubal ligation status: Secondary | ICD-10-CM

## 2020-08-24 DIAGNOSIS — Z6791 Unspecified blood type, Rh negative: Secondary | ICD-10-CM

## 2020-08-24 DIAGNOSIS — Z3480 Encounter for supervision of other normal pregnancy, unspecified trimester: Secondary | ICD-10-CM

## 2020-08-24 DIAGNOSIS — F32A Depression, unspecified: Secondary | ICD-10-CM | POA: Diagnosis present

## 2020-08-24 DIAGNOSIS — O26899 Other specified pregnancy related conditions, unspecified trimester: Secondary | ICD-10-CM

## 2020-08-24 LAB — URINALYSIS, ROUTINE W REFLEX MICROSCOPIC
Bilirubin Urine: NEGATIVE
Glucose, UA: NEGATIVE mg/dL
Hgb urine dipstick: NEGATIVE
Ketones, ur: NEGATIVE mg/dL
Leukocytes,Ua: NEGATIVE
Nitrite: NEGATIVE
Protein, ur: NEGATIVE mg/dL
Specific Gravity, Urine: 1.02 (ref 1.005–1.030)
pH: 6 (ref 5.0–8.0)

## 2020-08-24 SURGERY — Surgical Case
Anesthesia: Spinal | Wound class: Clean Contaminated

## 2020-08-24 MED ORDER — WITCH HAZEL-GLYCERIN EX PADS: 1.0000 "application " | MEDICATED_PAD | CUTANEOUS | Status: DC | PRN

## 2020-08-24 MED ORDER — PRENATAL MULTIVITAMIN CH
1.0000 | ORAL_TABLET | Freq: Every day | ORAL | Status: DC
  Administered 2020-08-25: 1 via ORAL
  Filled 2020-08-24 (×2): qty 1

## 2020-08-24 MED ORDER — OXYTOCIN-SODIUM CHLORIDE 30-0.9 UT/500ML-% IV SOLN: 2.5000 [IU]/h | INTRAVENOUS | Status: AC

## 2020-08-24 MED ORDER — MORPHINE SULFATE (PF) 0.5 MG/ML IJ SOLN
INTRAMUSCULAR | Status: AC
  Filled 2020-08-24: qty 10

## 2020-08-24 MED ORDER — SIMETHICONE 80 MG PO CHEW
80.0000 mg | CHEWABLE_TABLET | ORAL | Status: DC | PRN
  Administered 2020-08-24: 80 mg via ORAL
  Filled 2020-08-24: qty 1

## 2020-08-24 MED ORDER — DIPHENHYDRAMINE HCL 50 MG/ML IJ SOLN
12.5000 mg | INTRAMUSCULAR | Status: DC | PRN
  Administered 2020-08-24: 12.5 mg via INTRAVENOUS
  Filled 2020-08-24: qty 1

## 2020-08-24 MED ORDER — CEFAZOLIN SODIUM-DEXTROSE 2-4 GM/100ML-% IV SOLN
INTRAVENOUS | Status: AC
  Filled 2020-08-24: qty 100

## 2020-08-24 MED ORDER — NALBUPHINE HCL 10 MG/ML IJ SOLN: 5.0000 mg | Freq: Once | INTRAMUSCULAR | Status: DC | PRN

## 2020-08-24 MED ORDER — NALOXONE HCL 4 MG/10ML IJ SOLN
1.0000 ug/kg/h | INTRAVENOUS | Status: DC | PRN
  Filled 2020-08-24: qty 5

## 2020-08-24 MED ORDER — LACTATED RINGERS IV SOLN: INTRAVENOUS | Status: DC

## 2020-08-24 MED ORDER — ONDANSETRON HCL 4 MG/2ML IJ SOLN
INTRAMUSCULAR | Status: DC | PRN
  Administered 2020-08-24: 4 mg via INTRAVENOUS

## 2020-08-24 MED ORDER — NALOXONE HCL 0.4 MG/ML IJ SOLN: 0.4000 mg | INTRAMUSCULAR | Status: DC | PRN

## 2020-08-24 MED ORDER — SOD CITRATE-CITRIC ACID 500-334 MG/5ML PO SOLN
30.0000 mL | Freq: Once | ORAL | Status: AC
  Administered 2020-08-24: 30 mL via ORAL

## 2020-08-24 MED ORDER — SIMETHICONE 80 MG PO CHEW
80.0000 mg | CHEWABLE_TABLET | ORAL | Status: DC
  Administered 2020-08-24 – 2020-08-25 (×2): 80 mg via ORAL
  Filled 2020-08-24 (×2): qty 1

## 2020-08-24 MED ORDER — CEFAZOLIN SODIUM-DEXTROSE 2-4 GM/100ML-% IV SOLN
2.0000 g | INTRAVENOUS | Status: AC
  Administered 2020-08-24: 2 g via INTRAVENOUS

## 2020-08-24 MED ORDER — ACETAMINOPHEN 500 MG PO TABS
1000.0000 mg | ORAL_TABLET | Freq: Four times a day (QID) | ORAL | Status: DC
  Administered 2020-08-24 – 2020-08-26 (×8): 1000 mg via ORAL
  Filled 2020-08-24 (×8): qty 2

## 2020-08-24 MED ORDER — OXYCODONE HCL 5 MG/5ML PO SOLN: 5.0000 mg | Freq: Once | ORAL | Status: DC | PRN

## 2020-08-24 MED ORDER — OXYCODONE HCL 5 MG PO TABS
5.0000 mg | ORAL_TABLET | ORAL | Status: DC | PRN
  Administered 2020-08-25: 5 mg via ORAL
  Filled 2020-08-24: qty 1

## 2020-08-24 MED ORDER — SODIUM CHLORIDE 0.9% FLUSH: 3.0000 mL | INTRAVENOUS | Status: DC | PRN

## 2020-08-24 MED ORDER — ONDANSETRON HCL 4 MG/2ML IJ SOLN: 4.0000 mg | Freq: Three times a day (TID) | INTRAMUSCULAR | Status: DC | PRN

## 2020-08-24 MED ORDER — FENTANYL CITRATE (PF) 100 MCG/2ML IJ SOLN
INTRAMUSCULAR | Status: AC
  Filled 2020-08-24: qty 2

## 2020-08-24 MED ORDER — DIBUCAINE (PERIANAL) 1 % EX OINT: 1.0000 "application " | TOPICAL_OINTMENT | CUTANEOUS | Status: DC | PRN

## 2020-08-24 MED ORDER — MENTHOL 3 MG MT LOZG: 1.0000 | LOZENGE | OROMUCOSAL | Status: DC | PRN

## 2020-08-24 MED ORDER — IBUPROFEN 800 MG PO TABS
800.0000 mg | ORAL_TABLET | Freq: Three times a day (TID) | ORAL | Status: DC
  Administered 2020-08-24 – 2020-08-26 (×5): 800 mg via ORAL
  Filled 2020-08-24 (×5): qty 1

## 2020-08-24 MED ORDER — ACETAMINOPHEN 325 MG PO TABS: 325.0000 mg | ORAL_TABLET | ORAL | Status: DC | PRN

## 2020-08-24 MED ORDER — OXYTOCIN-SODIUM CHLORIDE 30-0.9 UT/500ML-% IV SOLN
INTRAVENOUS | Status: AC
  Filled 2020-08-24: qty 500

## 2020-08-24 MED ORDER — OXYTOCIN-SODIUM CHLORIDE 30-0.9 UT/500ML-% IV SOLN
INTRAVENOUS | Status: DC | PRN
  Administered 2020-08-24 (×2): 30 [IU] via INTRAVENOUS

## 2020-08-24 MED ORDER — MEPERIDINE HCL 25 MG/ML IJ SOLN: 6.2500 mg | INTRAMUSCULAR | Status: DC | PRN

## 2020-08-24 MED ORDER — SOD CITRATE-CITRIC ACID 500-334 MG/5ML PO SOLN
ORAL | Status: AC
  Filled 2020-08-24: qty 30

## 2020-08-24 MED ORDER — FENTANYL CITRATE (PF) 100 MCG/2ML IJ SOLN
INTRAMUSCULAR | Status: DC | PRN
  Administered 2020-08-24: 15 ug via INTRATHECAL

## 2020-08-24 MED ORDER — FENTANYL CITRATE (PF) 100 MCG/2ML IJ SOLN: 25.0000 ug | INTRAMUSCULAR | Status: DC | PRN

## 2020-08-24 MED ORDER — TETANUS-DIPHTH-ACELL PERTUSSIS 5-2.5-18.5 LF-MCG/0.5 IM SUSY: 0.5000 mL | PREFILLED_SYRINGE | Freq: Once | INTRAMUSCULAR | Status: DC

## 2020-08-24 MED ORDER — NALBUPHINE HCL 10 MG/ML IJ SOLN: 5.0000 mg | INTRAMUSCULAR | Status: DC | PRN

## 2020-08-24 MED ORDER — SIMETHICONE 80 MG PO CHEW
80.0000 mg | CHEWABLE_TABLET | Freq: Three times a day (TID) | ORAL | Status: DC
  Administered 2020-08-24 – 2020-08-25 (×5): 80 mg via ORAL
  Filled 2020-08-24 (×6): qty 1

## 2020-08-24 MED ORDER — SCOPOLAMINE 1 MG/3DAYS TD PT72
1.0000 | MEDICATED_PATCH | Freq: Once | TRANSDERMAL | Status: DC
  Administered 2020-08-24: 1.5 mg via TRANSDERMAL
  Filled 2020-08-24: qty 1

## 2020-08-24 MED ORDER — PHENYLEPHRINE HCL-NACL 20-0.9 MG/250ML-% IV SOLN
INTRAVENOUS | Status: AC
  Filled 2020-08-24: qty 250

## 2020-08-24 MED ORDER — MORPHINE SULFATE (PF) 0.5 MG/ML IJ SOLN
INTRAMUSCULAR | Status: DC | PRN
  Administered 2020-08-24: 150 ug via INTRATHECAL

## 2020-08-24 MED ORDER — KETOROLAC TROMETHAMINE 30 MG/ML IJ SOLN
30.0000 mg | Freq: Four times a day (QID) | INTRAMUSCULAR | Status: AC | PRN
  Administered 2020-08-24: 30 mg via INTRAVENOUS
  Filled 2020-08-24 (×2): qty 1

## 2020-08-24 MED ORDER — PHENYLEPHRINE HCL-NACL 20-0.9 MG/250ML-% IV SOLN
INTRAVENOUS | Status: DC | PRN
  Administered 2020-08-24: 60 ug/min via INTRAVENOUS

## 2020-08-24 MED ORDER — SENNOSIDES-DOCUSATE SODIUM 8.6-50 MG PO TABS
2.0000 | ORAL_TABLET | ORAL | Status: DC
  Administered 2020-08-24 – 2020-08-25 (×2): 2 via ORAL
  Filled 2020-08-24 (×2): qty 2

## 2020-08-24 MED ORDER — OXYCODONE HCL 5 MG PO TABS: 5.0000 mg | ORAL_TABLET | Freq: Once | ORAL | Status: DC | PRN

## 2020-08-24 MED ORDER — ONDANSETRON HCL 4 MG/2ML IJ SOLN: 4.0000 mg | Freq: Once | INTRAMUSCULAR | Status: DC | PRN

## 2020-08-24 MED ORDER — KETOROLAC TROMETHAMINE 30 MG/ML IJ SOLN: 30.0000 mg | Freq: Four times a day (QID) | INTRAMUSCULAR | Status: AC | PRN

## 2020-08-24 MED ORDER — POVIDONE-IODINE 10 % EX SWAB
2.0000 "application " | Freq: Once | CUTANEOUS | Status: AC
  Administered 2020-08-24: 2 via TOPICAL

## 2020-08-24 MED ORDER — COCONUT OIL OIL: 1.0000 "application " | TOPICAL_OIL | Status: DC | PRN

## 2020-08-24 MED ORDER — DIPHENHYDRAMINE HCL 25 MG PO CAPS: 25.0000 mg | ORAL_CAPSULE | Freq: Four times a day (QID) | ORAL | Status: DC | PRN

## 2020-08-24 MED ORDER — DIPHENHYDRAMINE HCL 25 MG PO CAPS
25.0000 mg | ORAL_CAPSULE | ORAL | Status: DC | PRN
  Administered 2020-08-24: 25 mg via ORAL
  Filled 2020-08-24: qty 1

## 2020-08-24 MED ORDER — BUPIVACAINE IN DEXTROSE 0.75-8.25 % IT SOLN
INTRATHECAL | Status: DC | PRN
  Administered 2020-08-24: 1.4 mL via INTRATHECAL

## 2020-08-24 MED ORDER — ACETAMINOPHEN 160 MG/5ML PO SOLN: 325.0000 mg | ORAL | Status: DC | PRN

## 2020-08-24 MED ORDER — ENOXAPARIN SODIUM 40 MG/0.4ML ~~LOC~~ SOLN
40.0000 mg | SUBCUTANEOUS | Status: DC
  Administered 2020-08-25: 40 mg via SUBCUTANEOUS
  Filled 2020-08-24 (×2): qty 0.4

## 2020-08-24 MED ORDER — ONDANSETRON HCL 4 MG/2ML IJ SOLN
INTRAMUSCULAR | Status: AC
  Filled 2020-08-24: qty 2

## 2020-08-24 SURGICAL SUPPLY — 36 items
CLAMP CORD UMBIL (MISCELLANEOUS) IMPLANT
CLOTH BEACON ORANGE TIMEOUT ST (SAFETY) ×3 IMPLANT
DERMABOND ADVANCED (GAUZE/BANDAGES/DRESSINGS) ×4
DERMABOND ADVANCED .7 DNX12 (GAUZE/BANDAGES/DRESSINGS) ×2 IMPLANT
DRSG OPSITE POSTOP 4X10 (GAUZE/BANDAGES/DRESSINGS) ×3 IMPLANT
ELECT REM PT RETURN 9FT ADLT (ELECTROSURGICAL) ×3
ELECTRODE REM PT RTRN 9FT ADLT (ELECTROSURGICAL) ×1 IMPLANT
EXTRACTOR VACUUM BELL STYLE (SUCTIONS) IMPLANT
GLOVE BIOGEL PI IND STRL 7.0 (GLOVE) ×1 IMPLANT
GLOVE BIOGEL PI IND STRL 8 (GLOVE) ×1 IMPLANT
GLOVE BIOGEL PI INDICATOR 7.0 (GLOVE) ×2
GLOVE BIOGEL PI INDICATOR 8 (GLOVE) ×2
GLOVE ECLIPSE 8.0 STRL XLNG CF (GLOVE) ×3 IMPLANT
GOWN STRL REUS W/TWL LRG LVL3 (GOWN DISPOSABLE) ×6 IMPLANT
KIT ABG SYR 3ML LUER SLIP (SYRINGE) ×3 IMPLANT
NEEDLE HYPO 18GX1.5 BLUNT FILL (NEEDLE) ×3 IMPLANT
NEEDLE HYPO 22GX1.5 SAFETY (NEEDLE) ×3 IMPLANT
NEEDLE HYPO 25X5/8 SAFETYGLIDE (NEEDLE) ×3 IMPLANT
NS IRRIG 1000ML POUR BTL (IV SOLUTION) ×3 IMPLANT
PACK C SECTION WH (CUSTOM PROCEDURE TRAY) ×3 IMPLANT
PAD OB MATERNITY 4.3X12.25 (PERSONAL CARE ITEMS) ×3 IMPLANT
PENCIL SMOKE EVAC W/HOLSTER (ELECTROSURGICAL) ×3 IMPLANT
RTRCTR C-SECT PINK 25CM LRG (MISCELLANEOUS) IMPLANT
SUT CHROMIC 0 CT 1 (SUTURE) ×3 IMPLANT
SUT MNCRL 0 VIOLET CTX 36 (SUTURE) ×2 IMPLANT
SUT MONOCRYL 0 CTX 36 (SUTURE) ×4
SUT PLAIN 2 0 (SUTURE) ×6
SUT PLAIN 2 0 XLH (SUTURE) IMPLANT
SUT PLAIN ABS 2-0 CT1 27XMFL (SUTURE) ×3 IMPLANT
SUT VIC AB 0 CTX 36 (SUTURE) ×2
SUT VIC AB 0 CTX36XBRD ANBCTRL (SUTURE) ×1 IMPLANT
SUT VIC AB 4-0 KS 27 (SUTURE) IMPLANT
SYR 20CC LL (SYRINGE) ×6 IMPLANT
TOWEL OR 17X24 6PK STRL BLUE (TOWEL DISPOSABLE) ×3 IMPLANT
TRAY FOLEY W/BAG SLVR 14FR LF (SET/KITS/TRAYS/PACK) IMPLANT
WATER STERILE IRR 1000ML POUR (IV SOLUTION) ×3 IMPLANT

## 2020-08-24 NOTE — Anesthesia Procedure Notes (Signed)
Spinal  Patient location during procedure: OR Start time: 08/24/2020 7:20 AM End time: 08/24/2020 7:25 AM Staffing Anesthesiologist: Bethena Midget, MD Preanesthetic Checklist Completed: patient identified, IV checked, site marked, risks and benefits discussed, surgical consent, monitors and equipment checked, pre-op evaluation and timeout performed Spinal Block Patient position: sitting Prep: DuraPrep Patient monitoring: heart rate, cardiac monitor, continuous pulse ox and blood pressure Approach: midline Location: L3-4 Injection technique: single-shot Needle Needle type: Sprotte  Needle gauge: 24 G Needle length: 9 cm Assessment Sensory level: T4

## 2020-08-24 NOTE — H&P (Addendum)
OBSTETRIC ADMISSION HISTORY AND PHYSICAL  Kaitlyn Fisher is a 24 y.o. female [phone] with IUP at [redacted]w[redacted]d by L/8 presenting for scheduled repeat Cesarean. She reports +FMs, No LOF, no VB, no blurry vision, headaches or peripheral edema, and RUQ pain.  She plans on breast feeding. She requests BTL for birth control.  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 Anemia   . Depression    pp depression  . Missed ab     Past Surgical History: Past Surgical History:  Procedure Laterality Date  . CESAREAN SECTION    . CESAREAN SECTION N/A 03/30/2016   Procedure: REPEAT CESAREAN SECTION;  Surgeon: 04/01/2016, MD;  Location: [facility] BIRTHING SUITES;  Service: Obstetrics;  Laterality: N/A;  . CESAREAN SECTION N/A 06/28/2017   Procedure: REPEAT CESAREAN SECTION;  Surgeon: 06/30/2017, MD;  Location: [facility] BIRTHING SUITES;  Service: Obstetrics;  Laterality: N/A;  . WISDOM TOOTH EXTRACTION      Obstetrical History: OB History    Gravida  5   Para  3   Term  3   Preterm      AB  1   Living  3     SAB  1   TAB      Ectopic      Multiple  0   Live Births  3           Social History Social History   Socioeconomic History  . Marital status: Single    Spouse name: Not on file  . Number of children: 3  . Years of education: Not on file  . Highest education level: Not on file  Occupational History  . Not on file  Tobacco Use  . Smoking status: Never Smoker  . Smokeless tobacco: Never Used  Vaping Use  . Vaping Use: Never used  Substance and Sexual Activity  . Alcohol use: No  . Drug use: No  . Sexual activity: Yes    Birth  control/protection: None  Other Topics Concern  . Not on file  Social History Narrative  . Not on file   Social Determinants of Health   Financial Resource Strain: Low Risk   . Difficulty of Paying Living Expenses: Not hard at all  Food Insecurity: No Food Insecurity  . Worried About [facility] in the Last Year: Never true  . Ran Out of Food in the Last Year: Never true  Transportation Needs: No Transportation Needs  . Lack of Transportation (Medical): No  . Lack of Transportation (Non-Medical): No  Physical Activity: Inactive  . Days of Exercise per Week: 0 days  . Minutes of Exercise per Session: 0 min  Stress: No Stress Concern Present  . Feeling of Stress : Not at all  Social Connections: Socially Isolated  . Frequency of Communication with Friends and Family: Twice a week  . Frequency of Social Gatherings with Friends and Family: Once a week  . Attends Religious Services: Never  . Active Member of Clubs or Organizations: No  . Attends Programme researcher, broadcasting/film/video Meetings: Never  . Marital Status: Never married    Family History: Family History  Problem Relation Age  of Onset  . Heart disease Maternal Grandmother   . Diabetes Maternal Grandmother   . Heart failure Maternal Grandmother   . Asthma Brother     Allergies: No Known Allergies  Medications Prior to Admission  Medication Sig Dispense Refill Last Dose  . ferrous sulfate 325 (65 FE) MG tablet Take 1 tablet (325 mg total) by mouth 2 (two) times daily with a meal. (Patient taking differently: Take 325 mg by mouth every evening. ) 60 tablet 3 Past Week at Unknown time  . Prenatal Vit-Fe Fumarate-FA (PRENATAL VITAMIN PO) Take 1 tablet by mouth daily.    Past Week at Unknown time  . Blood Pressure Monitor MISC For regular home bp monitoring during pregnancy 1 each 0      Review of Systems   All systems reviewed and negative except as stated in HPI  Temperature 98.4 F (36.9 C), temperature source Oral,  resp. rate 15, height 5' (1.524 m), weight 89.4 kg, last menstrual period 11/20/2019. General appearance: alert, cooperative and appears stated age Lungs: normal WOB Heart: regular rate and rhythm Abdomen: soft, non-tender Extremities: no sign of DVT Presentation: cephalic Fetal monitoring: HR 790 on doppler     Prenatal labs: ABO, Rh: --/--/A NEG (11/19 2409) Antibody: NEG (11/19 0921) Rubella: 2.39 (05/12 1219) RPR: NON REACTIVE (11/19 1015)  HBsAg: Negative (05/12 1219)  HIV: NON REACTIVE (11/19 0909)  GBS: Negative/-- (10/29 1130)  2 hr Glucola wnl Genetic screening  wnl  Anatomy US wnl  Prenatal Transfer Tool  Maternal Diabetes: No Genetic Screening: Normal Maternal Ultrasounds/Referrals: Normal Fetal Ultrasounds or other Referrals:  None Maternal Substance Abuse:  No Significant Maternal Medications:  None Significant Maternal Lab Results: Group B Strep negative and Other: silent carrier for alpha thal  No results found for this or any previous visit (from the past 24 hour(s)).  Patient Active Problem List   Diagnosis Date Noted  . Request for sterilization 04/02/2020  . Silent carrier for alpha-thalassemia 02/29/2020  . Encounter for supervision of other normal pregnancy, unspecified trimester 02/13/2020  . Rh negative state in antepartum period 02/13/2020  . Status post repeat low transverse cesarean section 06/28/2017  . Depression 12/15/2015    Assessment/Plan:  Kaitlyn Fisher is a 25 y.o. [id number] at [redacted]w[redacted]d here for scheduled repeat Cesarean. H/o Cesarean x3.  #Scheduled Repeat Cesarean: The risks of cesarean section were discussed with the patient including but were not limited to: bleeding which may require transfusion or reoperation; infection which may require antibiotics; injury to bowel, bladder, ureters or other surrounding organs; injury to the fetus; need for additional procedures including hysterectomy in the event of a life-threatening hemorrhage;  placental abnormalities wth subsequent pregnancies, incisional problems, thromboembolic phenomenon and other postoperative/anesthesia complications.  Patient also desires permanent sterilization.  Other reversible forms of contraception were discussed with patient; she declines all other modalities. Risks of procedure discussed with patient including but not limited to: risk of regret, permanence of method, bleeding, infection, injury to surrounding organs and need for additional procedures.  Failure risk of about 1% with increased risk of ectopic gestation if pregnancy occurs was also discussed with patient.  Also discussed possibility of post-tubal pain syndrome. The patient concurred with the proposed plan, giving informed written consent for the procedures.  Patient has been NPO since yesterday; she will remain NPO for procedure. Anesthesia and OR aware.  Preoperative prophylactic antibiotics and SCDs ordered on call to the OR.  To OR when ready.  #Pain: Per anesthesia #  FWB: HR 140 on doppler #ID: GBS negative; plan for ancef 2 g prior to OR #MOF: breast #MOC: BTL as noted above #Circ: no #Rh Negative: plan for rhogam PP  Sheila Oats, MD  08/24/2020, 6:58 AM

## 2020-08-24 NOTE — Progress Notes (Signed)
Mother would like infant circumcised here in the hospital since Medicaid covers the cost in house now. Earl Gala, Linda Hedges Pearsall

## 2020-08-24 NOTE — Anesthesia Postprocedure Evaluation (Signed)
Anesthesia Post Note  Patient: Kaitlyn Fisher  Procedure(s) Performed: REPEAT CESAREAN SECTION WITH BILATERAL TUBAL LIGATION (N/A )     Patient location during evaluation: Mother Baby Anesthesia Type: Spinal Level of consciousness: oriented and awake and alert Pain management: pain level controlled Vital Signs Assessment: post-procedure vital signs reviewed and stable Respiratory status: spontaneous breathing and respiratory function stable Cardiovascular status: blood pressure returned to baseline and stable Postop Assessment: no headache, no backache, no apparent nausea or vomiting and able to ambulate Anesthetic complications: no   No complications documented.  Last Vitals:  Vitals:   08/24/20 0931 08/24/20 0932  BP:    Pulse: 77 79  Resp: (!) 26 (!) 26  Temp:    SpO2: 98% 99%    Last Pain:  Vitals:   08/24/20 0930  TempSrc:   PainSc: 0-No pain   Pain Goal:    LLE Motor Response: Purposeful movement (08/24/20 0930) LLE Sensation: Decreased (08/24/20 0930) RLE Motor Response: Purposeful movement (08/24/20 0930) RLE Sensation: Decreased (08/24/20 0930)     Epidural/Spinal Function Cutaneous sensation: Able to Wiggle Toes (08/24/20 0930), Patient able to flex knees: Yes (08/24/20 0930), Patient able to lift hips off bed: No (08/24/20 0930), Back pain beyond tenderness at insertion site: No (08/24/20 0930), Progressively worsening motor and/or sensory loss: No (08/24/20 0930), Bowel and/or bladder incontinence post epidural: No (08/24/20 0930)  Mellody Dance

## 2020-08-24 NOTE — Op Note (Signed)
Cesarean Section & Bilateral Tubal Ligation Procedure Note   Kaitlyn Fisher  08/24/2020  Indications: Repeat Cesarean (h/o Cesarean x3), Desire for Permanent Sterilization, Multiparity  Pre-operative Diagnosis: Scheduled Repeat Cesarean with h/o PREVIOUS C-SECTION x3, Postpartum Bilateral Tubal Sterilization using modified Pomeroy  Post-operative Diagnosis: Same   Surgeon: Surgeon(s):    * Eure, Amaryllis Dyke, MD    * Lynnda Shields, MD  Anesthesia: spinal    Estimated Blood Loss: 127 mL   Total IV Fluids: 1500 ml (in addition to 1L in pre-op)  Urine Output: 200 ml  Specimens: Left & Right Fallopian Tubes  Findings: minimal adhesions, normal uterus, tubes and ovaries  Baby condition / location:  viable female, weight 3485 gm with APGAR: 8, 9     Couplet care / Skin to Skin  Complications: no complications  Indications: Kaitlyn Fisher is a 24 y.o. [id number] with an IUP [redacted]w[redacted]d presenting for scheduled repeat Cesarean given h/o Cesarean x3. She also desires permanent sterilization (BTL consent form signed 05/12/20).  The risks, benefits, complications, treatment options, and expected outcomes were discussed with the patient as noted in HPI. The patient concurred with the proposed plan, giving informed consent for repeat Cesarean and postpartum bilateral tubal ligation. Identified as Titus Mould and the procedure verified as C-Section Delivery.  Procedure Details: A Time Out was held and the above information confirmed.  The patient was taken back to the operative suite where spinal anesthesia was placed and found to be adequate. After induction of anesthesia, the patient was draped and prepped in the usual sterile manner and placed in a dorsal supine position with a leftward tilt. A low transverse incision was made and carried down through the subcutaneous tissue to the fascia. Fascial incision was made and extended transversely with use of Mayos. The fascia was separated from the underlying  rectus tissue superiorly and inferiorly. Minimal adhesions noted. The peritoneum was identified and entered bluntly. Peritoneal incision was extended longitudinally. A bladder blade was inserted and the utero-vesical peritoneal reflection was identified. A low transverse uterine incision was then made. Delivered from cephalic presentation was a 3485 gram viable Female with Apgar scores of 8 at one minute and 9 at five minutes. Cord ph was not sent the umbilical cord was clamped and cut cord blood was obtained for evaluation. The placenta was removed Intact and appeared normal. The uterine outline, tubes and ovaries appeared normal. The uterine incision was closed with a single layer of running locked sutures of 0 Monocryl.  Hemostasis was observed.   The uterus was then exteriorized and the left fallopian tube was identified and grasped with a Babcock clamp. An avascular midsection of the tube approximately 3-4cm from the cornua was grasped with the babcock clamps and brought into a knuckle. The tube was double ligated with one 0 plain gut suture and the intervening portion of tube was transected and removed.  Attention was then turned to the right fallopian tube after confirmation of identification by tracing the tube out to the fimbriae. The same procedure was then performed on the right Fallopian tube, with excellent hemostasis noted at both BTL sites.  Lavage was carried out until clear. The fascia was then reapproximated with running sutures of 0 Vicryl. The subcuticular closure was performed using 2 chromic gut. The skin was closed with 4-0Vicryl.   Instrument, sponge, and needle counts were correct prior the abdominal closure and were correct at the conclusion of the case.   Disposition: PACU - hemodynamically  stable.   Sheila Oats, MD OB Fellow, [facility] 08/24/2020 11:10 AM

## 2020-08-24 NOTE — Discharge Instructions (Signed)

## 2020-08-24 NOTE — Discharge Summary (Signed)
Postpartum Discharge Summary      Patient Name: Kaitlyn Fisher DOB: Jan 31, 1996 MRN: [id number]  Date of admission: 08/24/2020 Delivery date:08/24/2020  Delivering provider: Randa Ngo  Date of discharge: 08/26/2020  Admitting diagnosis: Cesarean delivery delivered [O82] Intrauterine pregnancy: [redacted]w[redacted]d    Secondary diagnosis:  Active Problems:   Depression   Status post repeat low transverse cesarean section   Rh negative state in antepartum period   Silent carrier for alpha-thalassemia   Request for sterilization   Cesarean delivery delivered   History of bilateral tubal ligation  Additional problems: as noted above   Discharge diagnosis: Repeat Cesarean with Bilateral Tubal Ligation (Modified Pomeroy)                                          Post partum procedures:postpartum tubal ligation Augmentation: N/A Complications: None  Hospital course: Sceduled C/S   24y.o. yo [id number] admitted to the hospital 08/24/2020 for scheduled cesarean section with the following indication: h/o Cesarean x3. Delivery details are as follows:  Membrane Rupture Time/Date: 7:55 AM ,08/24/2020   Delivery Method:C-Section, Low Transverse  Details of operation can be found in separate operative note. Postpartum bilateral tubal ligation was performed. Patient had an uncomplicated postpartum course.  She is ambulating, tolerating a regular diet, passing flatus, and urinating well. Patient is discharged home in stable condition on  08/26/20        Newborn Data: Birth date:08/24/2020  Birth time:7:55 AM  Gender:Female  Living status:Living  Apgars:8 ,9  Weight:3485 g     Magnesium Sulfate received: No BMZ received: No Rhophylac:No, Rhogam eval completed and rhogam not indicated MMR:N/A T-DaP:Given prenatally Flu: No Transfusion:No  Physical exam  Vitals:   08/25/20 1015 08/25/20 1334 08/25/20 1951 08/26/20 0500  BP: 121/69 112/72 103/67 115/66  Pulse: 75 80 72 64  Resp: _0 Temp: 98.3 F (36.8 C) 97.7 F (36.5 C) 99 F (37.2 C) 98.6 F (37 C)  TempSrc: Oral Oral Oral Oral  SpO2: 100% 100% 100% 100%  Weight:      Height:       General: alert, cooperative and no distress Lochia: appropriate Uterine Fundus: firm Incision: Healing well with no significant drainage DVT Evaluation: No evidence of DVT seen on physical exam. Labs: Lab Results  Component Value Date   WBC 8.0 08/25/2020   HGB 9.6 (L) 08/25/2020   HCT 31.4 (L) 08/25/2020   MCV 91.3 08/25/2020   PLT 204 08/25/2020   CMP Latest Ref Rng & Units 08/22/2020  Glucose 70 - 99 mg/dL 78  BUN 6 - 20 mg/dL <5(L)  Creatinine 0.44 - 1.00 mg/dL 0.55  Sodium 135 - 145 mmol/L 135  Potassium 3.5 - 5.1 mmol/L 3.7  Chloride 98 - 111 mmol/L 107  CO2 22 - 32 mmol/L 20(L)  Calcium 8.9 - 10.3 mg/dL 8.9  Total Protein 6.5 - 8.1 g/dL 6.2(L)  Total Bilirubin 0.3 - 1.2 mg/dL 0.4  Alkaline Phos 38 - 126 U/L 118  AST 15 - 41 U/L 15  ALT 0 - 44 U/L 8   Edinburgh Score: Edinburgh Postnatal Depression Scale Screening Tool 08/25/2020  I have been able to laugh and see the funny side of things. 0  I have looked forward with enjoyment to things. 0  I have blamed myself unnecessarily when things went  wrong. 0  I have been anxious or worried for no good reason. 0  I have felt scared or panicky for no good reason. 0  Things have been getting on top of me. 0  I have been so unhappy that I have had difficulty sleeping. 0  I have felt sad or miserable. 0  I have been so unhappy that I have been crying. 0  The thought of harming myself has occurred to me. 0  Edinburgh Postnatal Depression Scale Total 0     After visit meds:  Allergies as of 08/26/2020   No Known Allergies     Medication List    TAKE these medications   acetaminophen 500 MG tablet Commonly known as: TYLENOL Take 2 tablets (1,000 mg total) by mouth every 8 (eight) hours as needed.   Blood Pressure Monitor Misc For regular home  bp monitoring during pregnancy   ferrous sulfate 325 (65 FE) MG tablet Take 1 tablet (325 mg total) by mouth every other day. Start taking on: August 27, 2020 What changed: when to take this   ibuprofen 800 MG tablet Commonly known as: ADVIL Take 1 tablet (800 mg total) by mouth every 8 (eight) hours as needed.   oxyCODONE 5 MG immediate release tablet Commonly known as: Oxy IR/ROXICODONE Take 1-2 tablets (5-10 mg total) by mouth every 4 (four) hours as needed for moderate pain.   PRENATAL VITAMIN PO Take 1 tablet by mouth daily.            Discharge Care Instructions  (From admission, onward)         Start     Ordered   08/26/20 0000  Change dressing (specify)       Comments: Remove dressing 5-7 days after surgery   08/26/20 0719           Discharge home in stable condition Infant Feeding: Breast Infant Disposition:home with mother Discharge instruction: per After Visit Summary and Postpartum booklet. Activity: Advance as tolerated. Pelvic rest for 6 weeks.  Diet: routine diet Future Appointments:No future appointments. Follow up Visit: Message sent to [facility] on 08/24/20.  Please schedule this patient for a In person postpartum visit in 4 weeks with the following provider: Any provider. Additional Postpartum F/U:Postpartum Depression checkup 1 week and Incision check 1 week  High risk pregnancy complicated by: now h/o Cesarean x4, Depression (no current meds), Rh negative status, silent alpha thal carrier Delivery mode:  C-Section, Low Transverse  Anticipated Birth Control:  BTL done PP   08/26/2020 Julia M Marsala, MD   

## 2020-08-24 NOTE — Transfer of Care (Signed)
Immediate Anesthesia Transfer of Care Note  Patient: Kaitlyn Fisher  Procedure(s) Performed: REPEAT CESAREAN SECTION WITH BILATERAL TUBAL LIGATION (N/A )  Patient Location: PACU  Anesthesia Type:Spinal  Level of Consciousness: awake, alert  and oriented  Airway & Oxygen Therapy: Patient Spontanous Breathing  Post-op Assessment: Report given to RN and Post -op Vital signs reviewed and stable  Post vital signs: Reviewed and stable  Last Vitals:  Vitals Value Taken Time  BP 103/66 08/24/20 0856  Temp    Pulse 77 08/24/20 0900  Resp 17 08/24/20 0900  SpO2 99 % 08/24/20 0900  Vitals shown include unvalidated device data.  Last Pain:  Vitals:   08/24/20 0702  TempSrc:   PainSc: 0-No pain         Complications: No complications documented.

## 2020-08-25 LAB — CBC
HCT: 31.4 % — ABNORMAL LOW (ref 36.0–46.0)
Hemoglobin: 9.6 g/dL — ABNORMAL LOW (ref 12.0–15.0)
MCH: 27.9 pg (ref 26.0–34.0)
MCHC: 30.6 g/dL (ref 30.0–36.0)
MCV: 91.3 fL (ref 80.0–100.0)
Platelets: 204 10*3/uL (ref 150–400)
RBC: 3.44 MIL/uL — ABNORMAL LOW (ref 3.87–5.11)
RDW: 16.2 % — ABNORMAL HIGH (ref 11.5–15.5)
WBC: 8 10*3/uL (ref 4.0–10.5)
nRBC: 0 % (ref 0.0–0.2)

## 2020-08-25 MED ORDER — FERROUS SULFATE 325 (65 FE) MG PO TABS
325.0000 mg | ORAL_TABLET | ORAL | Status: DC
  Administered 2020-08-25: 325 mg via ORAL
  Filled 2020-08-25: qty 1

## 2020-08-25 NOTE — Lactation Note (Signed)
This note was copied from a baby's chart. Lactation Consultation Note  Patient Name: Kaitlyn Fisher WRUEA'V Date: 08/25/2020    [facility] Initial Note:  Per RN, mother is formula feeding only.   Maternal Data    Feeding    LATCH Score                   Interventions    Lactation Tools Discussed/Used     Consult Status      Chimene Salo R Martita Brumm 08/25/2020, 5:27 PM

## 2020-08-25 NOTE — Social Work (Signed)
Kaitlyn Fisher was referred for history of Depression and Postpartum Depression.   * Referral screened out by Clinical Social Worker because none of the following criteria appear to apply:  ~ History of anxiety/depression during this pregnancy, or of post-partum depression following prior delivery. Upon chart review it is noted that in 2018, Kaitlyn Fisher stated she experienced PPD with the previous pregnancy.  ~ Diagnosis of anxiety and/or depression within last 3 years. Chart review notes depression diagnosis date at or around 2017.  OR * Kaitlyn Fisher's symptoms currently being treated with medication and/or therapy.  Please contact the Clinical Social Worker if needs arise, by [facility] request, or if Kaitlyn Fisher scores greater than 9/yes to question 10 on Edinburgh Postpartum Depression Screen.  Manfred Arch, LCSWA Clinical Social Work [employer] and [employer]  [phone]

## 2020-08-25 NOTE — Progress Notes (Signed)
Subjective: Postpartum Day 1: Cesarean Delivery Patient reports incisional pain, + flatus and no problems voiding.    Objective: Vital signs in last 24 hours: Temp:  [97.3 F (36.3 C)-98.6 F (37 C)] 98.4 F (36.9 C) (11/22 0400) Pulse Rate:  [67-98] 67 (11/22 0400) Resp:  [14-28] 16 (11/22 0400) BP: (92-122)/(61-86) 116/65 (11/22 0400) SpO2:  [98 %-100 %] 100 % (11/22 0400)  Physical Exam:  General: alert, cooperative and no distress Lochia: appropriate Uterine Fundus: firm Incision: no significant drainage, no dehiscence, no significant erythema DVT Evaluation: No evidence of DVT seen on physical exam.  Recent Labs    08/22/20 1015  HGB 10.7*  HCT 34.8*    Assessment/Plan: Status post Cesarean section. Doing well postoperatively.  Continue current care.  Rolm Bookbinder CNM 08/25/2020, 7:08 AM

## 2020-08-26 LAB — BIRTH TISSUE RECOVERY COLLECTION (PLACENTA DONATION)

## 2020-08-26 LAB — SURGICAL PATHOLOGY

## 2020-08-26 MED ORDER — FERROUS SULFATE 325 (65 FE) MG PO TABS: 325.0000 mg | ORAL_TABLET | ORAL | 0 refills | Status: AC

## 2020-08-26 MED ORDER — OXYCODONE HCL 5 MG PO TABS
5.0000 mg | ORAL_TABLET | ORAL | 0 refills | Status: AC | PRN
Start: 2020-08-26 — End: ?

## 2020-08-26 MED ORDER — ACETAMINOPHEN 500 MG PO TABS: 1000.0000 mg | ORAL_TABLET | Freq: Three times a day (TID) | ORAL | 0 refills | Status: AC | PRN

## 2020-08-26 MED ORDER — IBUPROFEN 800 MG PO TABS
800.0000 mg | ORAL_TABLET | Freq: Three times a day (TID) | ORAL | 0 refills | Status: DC | PRN
Start: 2020-08-26 — End: 2024-07-09

## 2024-04-13 ENCOUNTER — Emergency Department (HOSPITAL_COMMUNITY)
Admission: EM | Admit: 2024-04-13 | Discharge: 2024-04-13 | Discharge status: Against Medical Advice | Payer: MEDICAID | Attending: Emergency Medicine | Admitting: Emergency Medicine

## 2024-04-13 ENCOUNTER — Encounter (HOSPITAL_COMMUNITY): Payer: Self-pay

## 2024-04-13 ENCOUNTER — Other Ambulatory Visit: Payer: Self-pay

## 2024-04-13 DIAGNOSIS — R519 Headache, unspecified: Secondary | ICD-10-CM | POA: Diagnosis present

## 2024-04-13 NOTE — ED Provider Notes (Signed)
 [place] EMERGENCY DEPARTMENT AT [facility] Provider Note   CSN: [id number] Arrival date & time: 04/13/24  1210     Patient presents with: Headache   Kaitlyn Fisher is a 28 y.o. female.   The history is provided by the patient and medical records. No language interpreter was used.  Headache    28 year old female with history of depression, anemia, presenting with complaint of headache patient states for the past 2 weeks she has had intermittent headaches.  She describes headache as a throbbing sensation especially when she goes out into bright lights into the sun.  Headache is improved when she takes ibuprofen  or if she goes back into less bright area.  She does not endorse any active headache at this time.  Headache is not associate with double vision, loss of vision, nausea, vomiting, sensitivity, fever, chills, neck pain, focal numbness, focal weakness or rash.  Last menstruation was a month ago.  Prior to Admission medications   Medication Sig Start Date End Date Taking? Authorizing Provider  acetaminophen  (TYLENOL ) 500 MG tablet Take 2 tablets (1,000 mg total) by mouth every 8 (eight) hours as needed. 08/26/20   Vertell Recardo CHRISTELLA, MD  Blood Pressure Monitor MISC For regular home bp monitoring during pregnancy 02/13/20   Kizzie Suzen SAUNDERS, CNM  ferrous sulfate  325 (65 FE) MG tablet Take 1 tablet (325 mg total) by mouth every other day. 08/27/20   Vertell Recardo CHRISTELLA, MD  ibuprofen  (ADVIL ) 800 MG tablet Take 1 tablet (800 mg total) by mouth every 8 (eight) hours as needed. 08/26/20   Vertell Recardo CHRISTELLA, MD  oxyCODONE  (OXY IR/ROXICODONE ) 5 MG immediate release tablet Take 1-2 tablets (5-10 mg total) by mouth every 4 (four) hours as needed for moderate pain. 08/26/20   Vertell Recardo CHRISTELLA, MD  Prenatal Vit-Fe Fumarate-FA (PRENATAL VITAMIN PO) Take 1 tablet by mouth daily.     [provider]    Allergies: Patient has no known allergies.    Review of Systems   Neurological:  Positive for headaches.  All other systems reviewed and are negative.   Updated Vital Signs BP 114/77   Pulse 77   Temp 97.7 F (36.5 C)   Resp 18   Ht 5' 1 (1.549 m)   Wt 94.3 kg   LMP 03/24/2024   SpO2 98%   BMI 39.30 kg/m   Physical Exam Vitals and nursing note reviewed.  Constitutional:      General: She is not in acute distress.    Appearance: She is well-developed.  HENT:     Head: Normocephalic and atraumatic.  Eyes:     General: No scleral icterus.    Extraocular Movements: Extraocular movements intact.     Conjunctiva/sclera: Conjunctivae normal.     Pupils: Pupils are equal, round, and reactive to light. Pupils are equal.  Cardiovascular:     Rate and Rhythm: Normal rate and regular rhythm.  Pulmonary:     Effort: Pulmonary effort is normal.  Musculoskeletal:     Cervical back: Normal range of motion and neck supple.  Skin:    Findings: No rash.  Neurological:     Mental Status: She is alert and oriented to person, place, and time.     GCS: GCS eye subscore is 4. GCS verbal subscore is 5. GCS motor subscore is 6.     Cranial Nerves: No cranial nerve deficit.  Psychiatric:        Mood and Affect: Mood normal.     (  all labs ordered are listed, but only abnormal results are displayed) Labs Reviewed - No data to display  EKG: None  Radiology: No results found.   Procedures   Medications Ordered in the ED - No data to display                                  Medical Decision Making  BP 121/71 (BP Location: Left Arm)   Pulse 67   Temp 98.3 F (36.8 C) (Oral)   Resp 16   Ht 5' 1 (1.549 m)   Wt 94.3 kg   LMP 03/24/2024   SpO2 100%   BMI 39.30 kg/m   17:75 PM  28 year old female with history of depression, anemia, presenting with complaint of headache patient states for the past 2 weeks she has had intermittent headaches.  She describes headache as a throbbing sensation especially when she goes out into bright lights  into the sun.  Headache is improved when she takes ibuprofen  or if she goes back into less bright area.  She does not endorse any active headache at this time.  Headache is not associate with double vision, loss of vision, nausea, vomiting, sensitivity, fever, chills, neck pain, focal numbness, focal weakness or rash.  Last menstruation was a month ago.  On exam patient does have some light sensitivity when I shine bright light in size however pupil equal round reactive and extraocular movements intact.  Vision is intact, no cranial nerve deficit no nuchal rigidity, strength are equal throughout.  I have ordered labs however patient has left prior to labs being drawn because she has to go to pick up her kids.     Final diagnoses:  Bad headache    ED Discharge Orders     None          Nivia Colon, PA-C 04/13/24 1445    Elnor Jayson LABOR, DO 04/15/24 2120

## 2024-04-13 NOTE — ED Triage Notes (Signed)
 Pt to ED via triage c/o headache x 2 weeks, reports sensitivity to light, denies blurred vision.

## 2024-04-13 NOTE — ED Notes (Addendum)
 Pt not in her bed. Pt has left the hospital with no warning, was unable to sign ED AMA form. Pt was called on her cell phone she did leave the hospital and stated that she had to go get her kids. Pt advised to return if she needed to be evaluated.

## 2024-04-13 NOTE — ED Notes (Addendum)
 Pt in no acute distress. Pt cooperative and calm. Complains of headache x 2weeks and left eye irritation. Left eye is pink. Denies any blurred vision at this time. Notes blurred vision intermittent for a couple years but none today. Denies any chest pain, shob, or dizziness.

## 2024-07-09 ENCOUNTER — Ambulatory Visit
Admission: EM | Admit: 2024-07-09 | Discharge: 2024-07-09 | Disposition: A | Discharge status: Home / Self Care | Payer: MEDICAID

## 2024-07-09 DIAGNOSIS — N939 Abnormal uterine and vaginal bleeding, unspecified: Secondary | ICD-10-CM | POA: Diagnosis not present

## 2024-07-09 LAB — POCT URINE DIPSTICK
Bilirubin, UA: NEGATIVE
Glucose, UA: NEGATIVE mg/dL
Ketones, POC UA: NEGATIVE mg/dL
Leukocytes, UA: NEGATIVE
Nitrite, UA: NEGATIVE
POC PROTEIN,UA: NEGATIVE
Spec Grav, UA: 1.025 (ref 1.010–1.025)
Urobilinogen, UA: 1 U/dL
pH, UA: 6 (ref 5.0–8.0)

## 2024-07-09 LAB — POCT URINE PREGNANCY: Preg Test, Ur: NEGATIVE

## 2024-07-09 MED ORDER — IBUPROFEN 800 MG PO TABS: 800.0000 mg | ORAL_TABLET | Freq: Three times a day (TID) | ORAL | 0 refills | Status: AC | PRN

## 2024-07-09 NOTE — Discharge Instructions (Addendum)
  1. Abnormal uterine bleeding (AUB) (Primary) - POCT URINE DIPSTICK shows large blood, no nitrite, no leukocytes, no significant sign of urinary tract infection.  These findings are consistent with abnormal uterine bleeding - POCT urine pregnancy completed in UC is negative for pregnancy. - ibuprofen  (ADVIL ) 800 MG tablet; Take 1 tablet (800 mg total) by mouth every 8 (eight) hours as needed.  Dispense: 21 tablet; Refill: 0 - Continue to monitor symptoms if you have any increase in bleeding, increase in abdominal pain, or other abnormal symptoms follow-up in the ER immediately for further evaluation and treatment.

## 2024-07-09 NOTE — ED Triage Notes (Signed)
 Patient reports that her menstruation began this morning. Just before it started, she experienced a sudden pop sensation, followed by heavy bleeding that flowed down her lower body and into the toilet, lasting about an hour. She immediately took a shower and inserted a tampon, but the bleeding continued at a lighter, steady flow. She is also experiencing some abdominal pain and aching, though it is less severe than earlier this morning. She denies any nausea or vomiting.

## 2024-07-09 NOTE — ED Provider Notes (Signed)
 [facility]  Note:  This document was prepared using Conservation officer, historic buildings and may include unintentional dictation errors.  MRN: [id number] DOB: Feb 21, 1996  Subjective:   Kaitlyn Fisher is a 28 y.o. female presenting for abnormal bleeding that began this morning.  Patient reports that her menstrual cycle began this morning when she felt a sudden pop sensation and noticed increased heavy bleeding.  Patient reports that she had abdominal cramping that has now subsided.  Patient states she has changed tampons 3 times today which is abnormal.  Patient states usually first day of her period is fairly light.  Patient has not taken any over-the-counter medication to treat symptoms.  Denies any nausea vomiting, flank pain, burning with urination, increased urinary frequency.  No current facility-administered medications for this encounter.  Current Outpatient Medications:    chlorhexidine (PERIDEX) 0.12 % solution, Use as directed 15 mLs in the mouth or throat 2 (two) times daily., Disp: , Rfl:    ibuprofen  (ADVIL ) 800 MG tablet, Take 1 tablet (800 mg total) by mouth every 8 (eight) hours as needed., Disp: 21 tablet, Rfl: 0   lidocaine  (XYLOCAINE ) 5 % ointment, Apply 1 Application topically 2 (two) times daily as needed., Disp: , Rfl:    acetaminophen  (TYLENOL ) 500 MG tablet, Take 2 tablets (1,000 mg total) by mouth every 8 (eight) hours as needed., Disp: 30 tablet, Rfl: 0   Blood Pressure Monitor MISC, For regular home bp monitoring during pregnancy, Disp: 1 each, Rfl: 0   ferrous sulfate  325 (65 FE) MG tablet, Take 1 tablet (325 mg total) by mouth every other day., Disp: 90 tablet, Rfl: 0   oxyCODONE  (OXY IR/ROXICODONE ) 5 MG immediate release tablet, Take 1-2 tablets (5-10 mg total) by mouth every 4 (four) hours as needed for moderate pain., Disp: 15 tablet, Rfl: 0   Prenatal Vit-Fe Fumarate-FA (PRENATAL VITAMIN PO), Take 1 tablet by mouth daily. , Disp: , Rfl:    No Known  Allergies  Past Medical History:  Diagnosis Date   Anemia    Depression    pp depression   Missed ab      Past Surgical History:  Procedure Laterality Date   CESAREAN SECTION     CESAREAN SECTION N/A 03/30/2016   Procedure: REPEAT CESAREAN SECTION;  Surgeon: Norleen LULLA Server, MD;  Location: [facility] BIRTHING SU[facility];  Service: Obstetrics;  Laterality: N/A;   CESAREAN SECTION N/A 06/28/2017   Procedure: REPEAT CESAREAN SECTION;  Surgeon: Server Norleen LULLA, MD;  Location: [facility] BIRTHING SUIT[facility];  Service: Obstetrics;  Laterality: N/A;   CESAREAN SECTION WITH BILATERAL TUBAL LIGATION N/A 08/24/2020   Procedure: REPEAT CESAREAN SECTION WITH BILATERAL TUBAL LIGATION;  Surgeon: Jayne Vonn DEL, MD;  Location: [facility];  Service: Obstetrics;  Laterality: N/A;   WISDOM TOOTH EXTRACTION      Family History  Problem Relation Age of Onset   Heart disease Maternal Grandmother    Diabetes Maternal Grandmother    Heart failure Maternal Grandmother    Asthma Brother     Social History   Tobacco Use   Smoking status: Never   Smokeless tobacco: Never  Vaping Use   Vaping status: Never Used  Substance Use Topics   Alcohol use: No   Drug use: No    ROS Refer to HPI for ROS details.  Objective:   Vitals: BP 123/80 (BP Location: Left Arm)   Pulse 94   Temp 98.2 F (36.8 C) (Oral)   Resp 18  Ht 5' 1 (1.549 m)   Wt 197 lb (89.4 kg)   LMP 07/09/2024 (Exact Date)   SpO2 98%   BMI 37.22 kg/m   Physical Exam Vitals and nursing note reviewed.  Constitutional:      General: She is not in acute distress.    Appearance: She is well-developed. She is not ill-appearing or toxic-appearing.  HENT:     Head: Normocephalic and atraumatic.     Mouth/Throat:     Mouth: Mucous membranes are moist.  Cardiovascular:     Rate and Rhythm: Normal rate.  Pulmonary:     Effort: Pulmonary effort is normal. No respiratory distress.  Abdominal:     General: There is no distension.     Tenderness: There  is no abdominal tenderness. There is no right CVA tenderness, left CVA tenderness, guarding or rebound.  Genitourinary:    Vagina: Bleeding present. No vaginal discharge or tenderness.  Musculoskeletal:        General: Normal range of motion.  Skin:    General: Skin is warm and dry.  Neurological:     General: No focal deficit present.     Mental Status: She is alert and oriented to person, place, and time.  Psychiatric:        Mood and Affect: Mood normal.        Behavior: Behavior normal.     Procedures  Results for orders placed or performed during the hospital encounter of 07/09/24 (from the past 24 hours)  POCT URINE DIPSTICK     Status: Abnormal   Collection Time: 07/09/24  1:52 PM  Result Value Ref Range   Color, UA yellow yellow   Clarity, UA clear clear   Glucose, UA negative negative mg/dL   Bilirubin, UA negative negative   Ketones, POC UA negative negative mg/dL   Spec Grav, UA 8.974 8.989 - 1.025   Blood, UA large (A) negative   pH, UA 6.0 5.0 - 8.0   POC PROTEIN,UA negative negative, trace   Urobilinogen, UA 1.0 0.2 or 1.0 E.U./dL   Nitrite, UA Negative Negative   Leukocytes, UA Negative Negative  POCT urine pregnancy     Status: Normal   Collection Time: 07/09/24  1:58 PM  Result Value Ref Range   Preg Test, Ur Negative Negative    No results found.   Assessment and Plan :     Discharge Instructions       1. Abnormal uterine bleeding (AUB) (Primary) - POCT URINE DIPSTICK shows large blood, no nitrite, no leukocytes, no significant sign of urinary tract infection.  These findings are consistent with abnormal uterine bleeding - POCT urine pregnancy completed in UC is negative for pregnancy. - ibuprofen  (ADVIL ) 800 MG tablet; Take 1 tablet (800 mg total) by mouth every 8 (eight) hours as needed.  Dispense: 21 tablet; Refill: 0 - Continue to monitor symptoms if you have any increase in bleeding, increase in abdominal pain, or other abnormal symptoms  follow-up in the ER immediately for further evaluation and treatment.       [place] B Shantese Raven   Manolo Bosket, [place], [place] 07/09/24 1554
